# Patient Record
Sex: Female | Born: 1980 | Race: White | Hispanic: No | Marital: Single | Smoking: Current every day smoker
Health system: Southern US, Community
[De-identification: ages and names within clinical notes are randomized; demographics above are authoritative.]

## PROBLEM LIST (undated history)

## (undated) DIAGNOSIS — Z21 Asymptomatic human immunodeficiency virus [HIV] infection status: Secondary | ICD-10-CM

## (undated) DIAGNOSIS — B019 Varicella without complication: Secondary | ICD-10-CM

## (undated) DIAGNOSIS — B20 Human immunodeficiency virus [HIV] disease: Secondary | ICD-10-CM

## (undated) DIAGNOSIS — J45909 Unspecified asthma, uncomplicated: Secondary | ICD-10-CM

## (undated) HISTORY — DX: Human immunodeficiency virus (HIV) disease: B20

## (undated) HISTORY — DX: Asymptomatic human immunodeficiency virus (hiv) infection status: Z21

## (undated) HISTORY — PX: TYMPANOSTOMY TUBE PLACEMENT: SHX32

## (undated) HISTORY — DX: Unspecified asthma, uncomplicated: J45.909

## (undated) HISTORY — DX: Varicella without complication: B01.9

---

## 2010-07-28 LAB — HM PAP SMEAR: HM Pap smear: NORMAL

## 2012-10-06 ENCOUNTER — Emergency Department: Payer: Self-pay | Admitting: Emergency Medicine

## 2012-10-06 LAB — COMPREHENSIVE METABOLIC PANEL
Albumin: 3.8 g/dL (ref 3.4–5.0)
Alkaline Phosphatase: 65 U/L (ref 50–136)
Calcium, Total: 9 mg/dL (ref 8.5–10.1)
Co2: 24 mmol/L (ref 21–32)
Creatinine: 0.74 mg/dL (ref 0.60–1.30)
EGFR (Non-African Amer.): >60
Potassium: 4 mmol/L (ref 3.5–5.1)
Sodium: 139 mmol/L (ref 136–145)
Total Protein: 7.3 g/dL (ref 6.4–8.2)

## 2012-10-06 LAB — WET PREP, GENITAL

## 2012-10-06 LAB — URINALYSIS, COMPLETE
Bilirubin,UR: NEGATIVE
Blood: NEGATIVE
Ketone: NEGATIVE
Leukocyte Esterase: NEGATIVE
Ph: 5 (ref 4.5–8.0)
RBC,UR: 2 /HPF (ref 0–5)
Specific Gravity: 1.024 (ref 1.003–1.030)
Squamous Epithelial: 7

## 2012-10-06 LAB — CBC
MCHC: 33.5 g/dL (ref 32.0–36.0)
MCV: 85 fL (ref 80–100)
RDW: 14.1 % (ref 11.5–14.5)
WBC: 10.9 10*3/uL (ref 3.6–11.0)

## 2012-10-06 LAB — HCG, QUANTITATIVE, PREGNANCY: Beta Hcg, Quant.: <1 m[IU]/mL — ABNORMAL LOW

## 2013-06-27 HISTORY — PX: REPAIR PERONEAL TENDONS ANKLE: SUR1201

## 2013-08-19 ENCOUNTER — Ambulatory Visit: Payer: Self-pay

## 2013-08-19 ENCOUNTER — Other Ambulatory Visit: Payer: Self-pay | Admitting: Occupational Medicine

## 2013-08-19 DIAGNOSIS — R52 Pain, unspecified: Secondary | ICD-10-CM

## 2014-03-21 IMAGING — US US PELV - US TRANSVAGINAL
1 series · 13 of 25 positions shown · non-contrast
Comparison: none

REASON FOR EXAM: pelvic pain
COMMENTS:

PROCEDURE:     US  - US PELVIS EXAM W/TRANSVAGINAL  - October 06, 2012  [DATE]
RESULT:     Comparison: None.
TECHNIQUE: Multiple grayscale and color Doppler images were obtained of the
pelvis via transabdominal and endovaginal ultrasound.

[Series 1: us pelv - us transvaginal · 0.28mm/px · 13 of 125 slices shown]
[im 1/125]
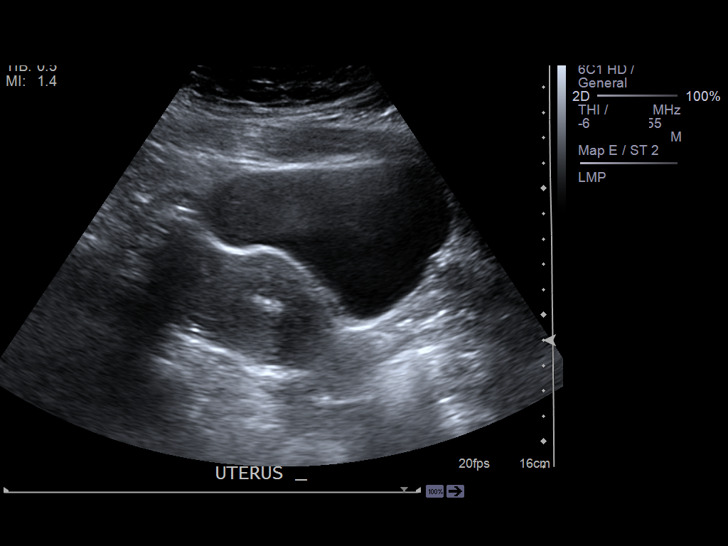
[im 11/125]
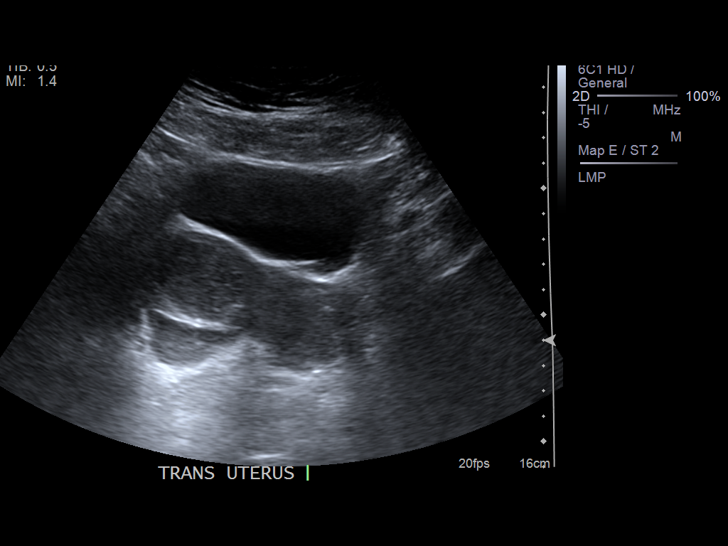
[im 21/125]
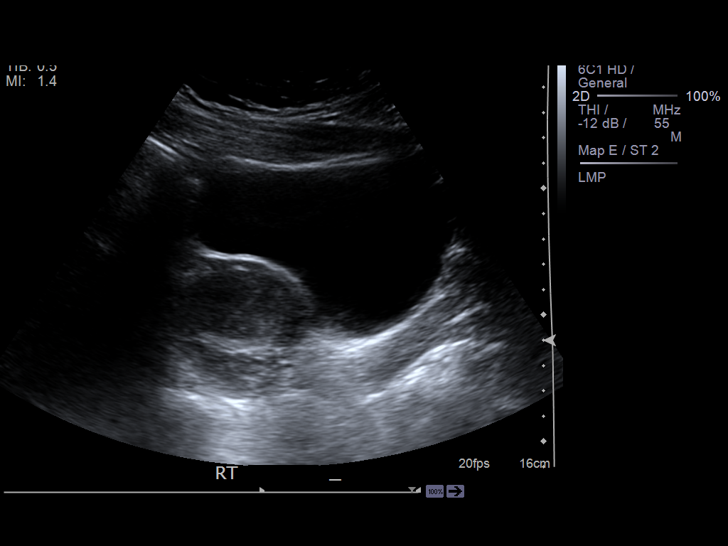
[im 32/125]
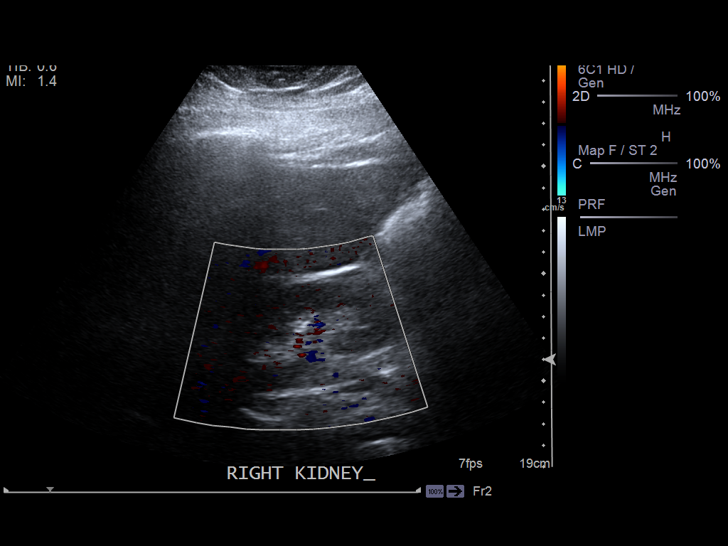
[im 42/125]
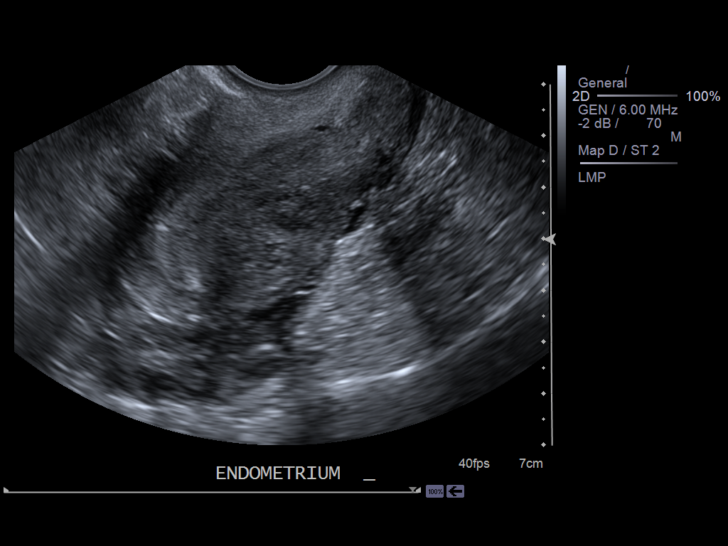
[im 52/125]
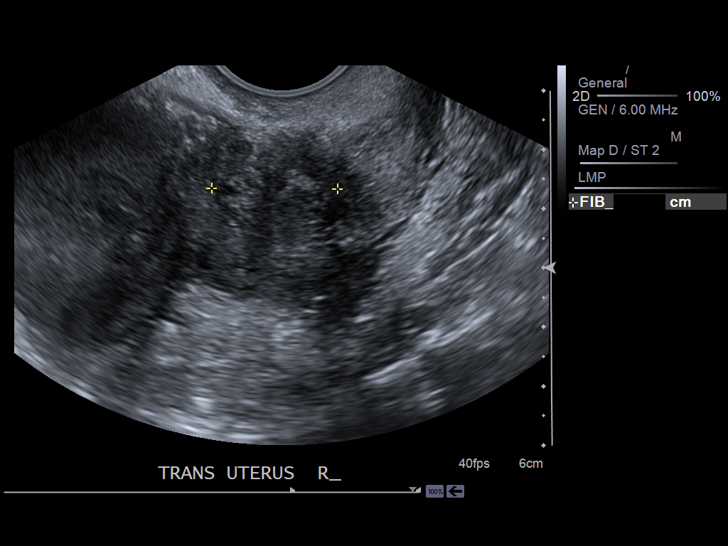
[im 63/125]
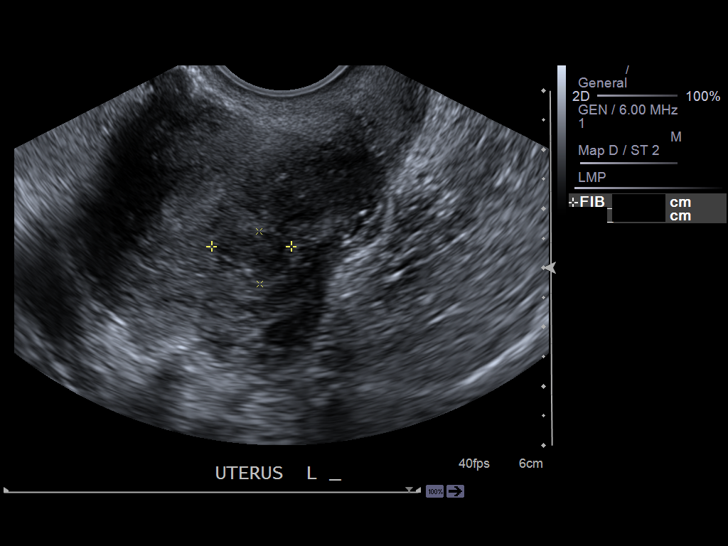
[im 73/125]
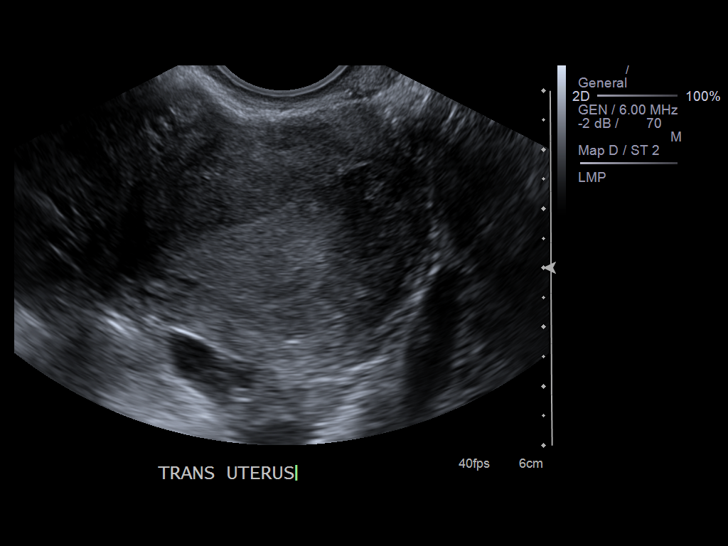
[im 83/125]
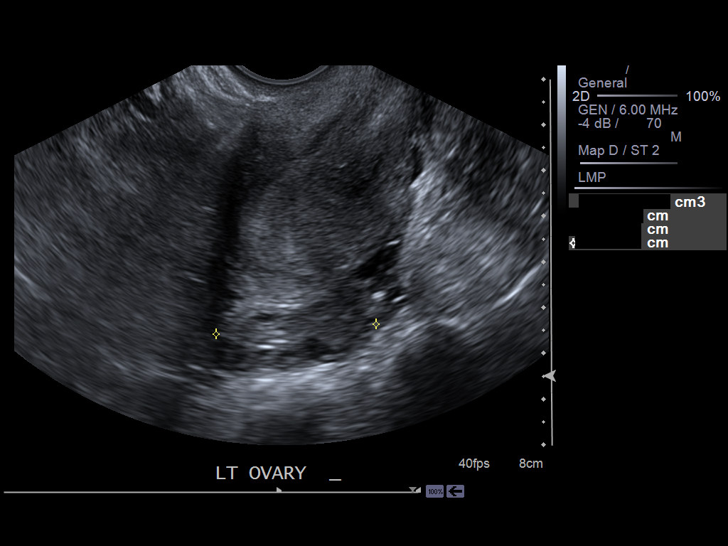
[im 94/125]
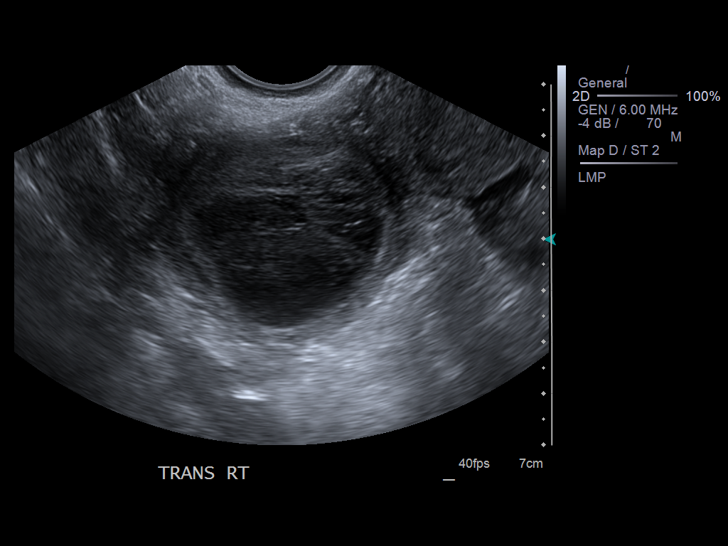
[im 104/125]
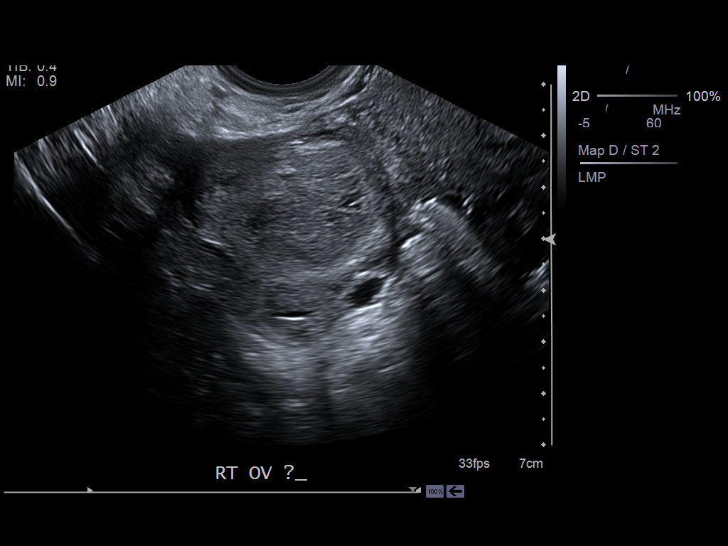
[im 114/125]
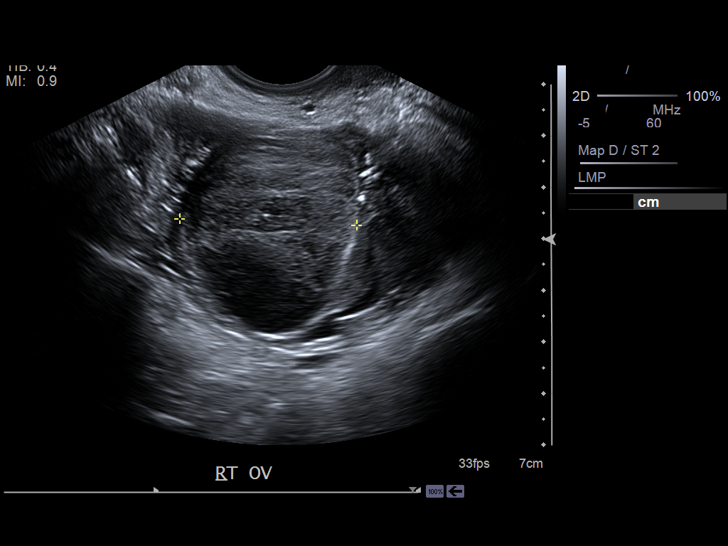
[im 125/125]
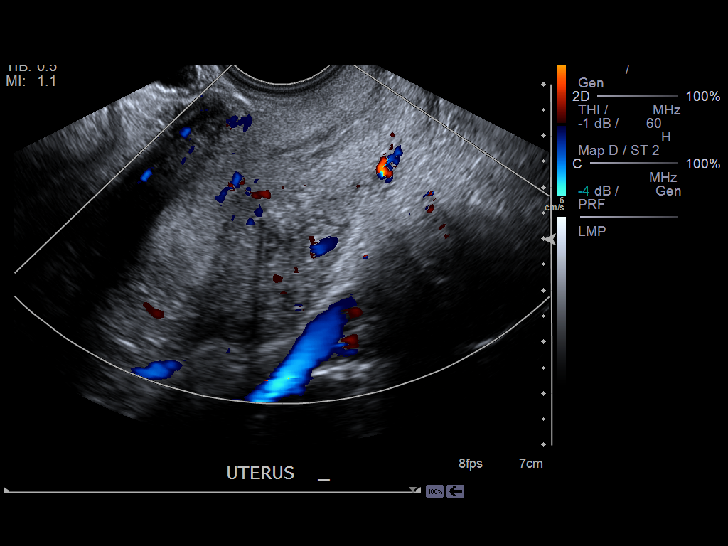

[13 of 25 positions shown; findings below may reference images not displayed]

FINDINGS: The uterus measures 7.3 x 4.9 x 4.3 cm. The endometrial stripe measures 14
mm in thickness. There are 2 round heterogeneous hypoechoic regions within
the uterus which likely represent fibroids. The largest measures 2.2 x 2.1 x
2.1 cm.

The left ovary measures 2.7 x 1.3 x 3.5 cm. The right ovary measures 5.3 x
4.3 x 4.6 cm. There is a heterogeneous mass associated with the right ovary
which demonstrates areas of hypoechogenicity as well as hyperechogenicity.
There is peripheral color Doppler flow. It measures 4.3 x 3.4 x 4.0 cm.
Color Doppler flow is associated with the bilateral ovaries. Spectral
Doppler imaging was not performed. There is a small amount of free fluid in
the pelvis, which is nonspecific.
IMPRESSION: Heterogeneous hypoechoic mass in the right ovary may represent a hemorrhagic
cyst. Endometrioma is a differential consideration. Recommend correlation
with beta-hCG to exclude ectopic pregnancy. Followup pelvic ultrasound is
recommended in 6 weeks to ensure resolution.

## 2014-08-19 ENCOUNTER — Encounter: Payer: Self-pay | Admitting: Nurse Practitioner

## 2014-08-19 ENCOUNTER — Encounter (INDEPENDENT_AMBULATORY_CARE_PROVIDER_SITE_OTHER): Payer: Self-pay

## 2014-08-19 ENCOUNTER — Ambulatory Visit (INDEPENDENT_AMBULATORY_CARE_PROVIDER_SITE_OTHER): Payer: BLUE CROSS/BLUE SHIELD | Admitting: Nurse Practitioner

## 2014-08-19 VITALS — BP 116/82 | HR 76 | Temp 98.2°F | Resp 14 | Ht 66.5 in | Wt 297.1 lb

## 2014-08-19 DIAGNOSIS — Z7689 Persons encountering health services in other specified circumstances: Secondary | ICD-10-CM | POA: Insufficient documentation

## 2014-08-19 DIAGNOSIS — Z720 Tobacco use: Secondary | ICD-10-CM

## 2014-08-19 DIAGNOSIS — Z7189 Other specified counseling: Secondary | ICD-10-CM

## 2014-08-19 DIAGNOSIS — F17200 Nicotine dependence, unspecified, uncomplicated: Secondary | ICD-10-CM

## 2014-08-19 DIAGNOSIS — B001 Herpesviral vesicular dermatitis: Secondary | ICD-10-CM | POA: Insufficient documentation

## 2014-08-19 MED ORDER — VALACYCLOVIR HCL 1 G PO TABS: 1000.0000 mg | ORAL_TABLET | Freq: Two times a day (BID) | ORAL | Status: DC

## 2014-08-19 NOTE — Patient Instructions (Addendum)
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 Take only when you get the cold sores. 20 pills should last awhile.   Make a appointment for physical with PAP.   Welcome to [facility]!!

## 2014-08-19 NOTE — Progress Notes (Signed)
Subjective:    Patient ID: Shelia Richardson, female    DOB: August 01, 1980, 34 y.o.   MRN: [id number]  HPI  Shelia Richardson is a 34 yo female establishing care and CC of cold sores.   1) New pt info:  Diet- Low calorie diet- avg. 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  HENT: Negative for tinnitus and trouble swallowing.   Eyes: Negative for visual disturbance.  Respiratory: Negative for chest tightness, shortness of breath and wheezing.   Cardiovascular: Negative for chest pain, palpitations and leg swelling.  Gastrointestinal: Negative for nausea, vomiting, abdominal pain, diarrhea and constipation.  Genitourinary: Negative for dysuria.  Musculoskeletal: Negative for back pain and neck pain.  Skin: Negative for rash.  Allergic/Immunologic: Negative for environmental allergies and food allergies.  Neurological: Negative for dizziness, weakness, numbness and headaches.  Hematological: Does not bruise/bleed easily.  Psychiatric/Behavioral: Negative for suicidal ideas. The patient is not nervous/anxious.    Past Medical History  Diagnosis Date  . Asthma   . HIV infection     Testing only.  Negative  . Chicken pox     History   Social History  . Marital Status: Single    Spouse Name: N/A  . Number of Children: N/A  . Years of Education: N/A   Occupational History  . Not on file.   Social History Main Topics  .  Smoking status: Current Every Day Smoker -- 1.00 packs/day for 15 years    Types: Cigarettes  . Smokeless tobacco: Not on file  . Alcohol Use: No     Comment: 1-2 x a year  . Drug Use: No  . Sexual Activity:    Partners: Male     Comment: 1 partner   Other Topics Concern  . 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 Tympanostomy tube placement      As a child  . Repair peroneal tendons ankle  2015    Right ankle tendon    Family History  Problem Relation Age of Onset  . Arthritis Mother   . Mental illness Mother   . Diabetes Father   . Mental illness Sister   . Mental illness Brother   . Cancer Maternal Grandmother   . Cancer Maternal Grandfather   . Cancer Paternal Grandmother   . Cancer Paternal Grandfather   . Diabetes Paternal Grandfather     Allergies  Allergen Reactions  . Septra [Sulfamethoxazole-Trimethoprim]     No current outpatient prescriptions on file prior to visit.   No current facility-administered medications on file prior to visit.  Objective:   Physical Exam  Constitutional: She is oriented to person, place, and time. She appears well-developed and well-nourished. No distress.  BP 116/82 mmHg  Pulse 76  Temp(Src) 98.2 F (36.8 C) (Oral)  Resp 14  Ht 5' 6.5" (1.689 m)  Wt 297 lb 1.9 oz (134.773 kg)  BMI 47.24 kg/m2  SpO2 97%  LMP  (Approximate)   HENT:  Head: Normocephalic and atraumatic.  Right Ear: External ear normal.  Left Ear: External ear normal.  Eyes: Right eye exhibits no discharge. Left eye exhibits no discharge. No scleral icterus.  Galsses  Cardiovascular: Normal rate, regular rhythm, normal heart sounds and intact distal pulses.  Exam reveals no gallop and no friction rub.   No murmur heard. Pulmonary/Chest: Effort  normal and breath sounds normal. No respiratory distress. She has no wheezes. She has no rales. She exhibits no tenderness.  Abdominal:  obese  Neurological: She is alert and oriented to person, place, and time. No cranial nerve deficit. She exhibits normal muscle tone. Coordination normal.  Skin: Skin is warm and dry. No rash noted. She is not diaphoretic.  Psychiatric: She has a normal mood and affect. Her behavior is normal. Judgment and thought content normal.      Assessment & Plan:

## 2014-08-19 NOTE — Progress Notes (Signed)
Pre visit review using our clinic review tool, if applicable. No additional management support is needed unless otherwise documented below in the visit note. 

## 2014-08-20 ENCOUNTER — Telehealth: Payer: Self-pay | Admitting: Nurse Practitioner

## 2014-08-20 NOTE — Telephone Encounter (Signed)
emmi mailed  °

## 2014-08-22 NOTE — Assessment & Plan Note (Signed)
Pt is not ready to quit. She reports her family wants to her stop smoking because they have and she reports she wants to continue smoking. She is aware of health risks and this is not a deterrent for her.

## 2014-08-22 NOTE — Assessment & Plan Note (Signed)
Valacyclovir eRx to take twice a day once at the beginning of a cold sore. Will follow.

## 2014-08-22 NOTE — Assessment & Plan Note (Signed)
Discussed acute and chronic issues. Reviewed health maintenance measures, PFSHx, and immunizations. No labs to obtain at this time. Will wait until annual physical.

## 2014-09-08 ENCOUNTER — Ambulatory Visit (INDEPENDENT_AMBULATORY_CARE_PROVIDER_SITE_OTHER): Payer: BLUE CROSS/BLUE SHIELD | Admitting: Nurse Practitioner

## 2014-09-08 ENCOUNTER — Encounter: Payer: Self-pay | Admitting: Nurse Practitioner

## 2014-09-08 ENCOUNTER — Other Ambulatory Visit (HOSPITAL_COMMUNITY)
Admission: RE | Admit: 2014-09-08 | Discharge: 2014-09-08 | Disposition: A | Discharge status: Home / Self Care | Payer: BLUE CROSS/BLUE SHIELD | Source: Ambulatory Visit | Attending: Nurse Practitioner | Admitting: Nurse Practitioner

## 2014-09-08 VITALS — BP 116/78 | HR 96 | Temp 98.1°F | Resp 14 | Ht 66.5 in | Wt 296.0 lb

## 2014-09-08 DIAGNOSIS — L02224 Furuncle of groin: Secondary | ICD-10-CM

## 2014-09-08 DIAGNOSIS — Z0000 Encounter for general adult medical examination without abnormal findings: Secondary | ICD-10-CM

## 2014-09-08 DIAGNOSIS — Z131 Encounter for screening for diabetes mellitus: Secondary | ICD-10-CM

## 2014-09-08 DIAGNOSIS — Z1329 Encounter for screening for other suspected endocrine disorder: Secondary | ICD-10-CM | POA: Diagnosis not present

## 2014-09-08 DIAGNOSIS — Z01419 Encounter for gynecological examination (general) (routine) without abnormal findings: Secondary | ICD-10-CM | POA: Diagnosis not present

## 2014-09-08 DIAGNOSIS — Z13220 Encounter for screening for lipoid disorders: Secondary | ICD-10-CM | POA: Diagnosis not present

## 2014-09-08 DIAGNOSIS — Z130 Encounter for screening for diseases of the blood and blood-forming organs and certain disorders involving the immune mechanism: Secondary | ICD-10-CM | POA: Diagnosis not present

## 2014-09-08 DIAGNOSIS — L02234 Carbuncle of groin: Secondary | ICD-10-CM

## 2014-09-08 DIAGNOSIS — Z1151 Encounter for screening for human papillomavirus (HPV): Secondary | ICD-10-CM | POA: Insufficient documentation

## 2014-09-08 LAB — COMPREHENSIVE METABOLIC PANEL
ALT: 13 U/L (ref 0–35)
AST: 15 U/L (ref 0–37)
Albumin: 4.1 g/dL (ref 3.5–5.2)
Alkaline Phosphatase: 66 U/L (ref 39–117)
BUN: 14 mg/dL (ref 6–23)
CO2: 27 mEq/L (ref 19–32)
Calcium: 9.4 mg/dL (ref 8.4–10.5)
Chloride: 105 mEq/L (ref 96–112)
Creatinine, Ser: 0.73 mg/dL (ref 0.40–1.20)
GFR: 98.32 mL/min (ref >60.00)
Glucose, Bld: 100 mg/dL — ABNORMAL HIGH (ref 70–99)
Potassium: 4.3 mEq/L (ref 3.5–5.1)
Sodium: 136 mEq/L (ref 135–145)
TOTAL PROTEIN: 7.1 g/dL (ref 6.0–8.3)
Total Bilirubin: 0.2 mg/dL (ref 0.2–1.2)

## 2014-09-08 LAB — LIPID PANEL
Cholesterol: 175 mg/dL (ref 0–200)
HDL: 38.8 mg/dL — ABNORMAL LOW (ref >39.00)
LDL Cholesterol: 98 mg/dL (ref 0–99)
NonHDL: 136.2
Total CHOL/HDL Ratio: 5
Triglycerides: 189 mg/dL — ABNORMAL HIGH (ref 0.0–149.0)
VLDL: 37.8 mg/dL (ref 0.0–40.0)

## 2014-09-08 LAB — TSH: TSH: 3.19 u[IU]/mL (ref 0.35–4.50)

## 2014-09-08 LAB — CBC WITH DIFFERENTIAL/PLATELET
Basophils Absolute: 0 10*3/uL (ref 0.0–0.1)
Basophils Relative: 0.2 % (ref 0.0–3.0)
Eosinophils Absolute: 0.2 10*3/uL (ref 0.0–0.7)
Eosinophils Relative: 1.6 % (ref 0.0–5.0)
HCT: 38.8 % (ref 36.0–46.0)
Hemoglobin: 12.9 g/dL (ref 12.0–15.0)
Lymphocytes Relative: 36.8 % (ref 12.0–46.0)
Lymphs Abs: 4.7 10*3/uL — ABNORMAL HIGH (ref 0.7–4.0)
MCHC: 33.3 g/dL (ref 30.0–36.0)
MCV: 81.8 fl (ref 78.0–100.0)
Monocytes Absolute: 0.7 10*3/uL (ref 0.1–1.0)
Monocytes Relative: 5.7 % (ref 3.0–12.0)
Neutro Abs: 7 10*3/uL (ref 1.4–7.7)
Neutrophils Relative %: 55.7 % (ref 43.0–77.0)
Platelets: 195 10*3/uL (ref 150.0–400.0)
RBC: 4.75 Mil/uL (ref 3.87–5.11)
RDW: 14.7 % (ref 11.5–15.5)
WBC: 12.6 10*3/uL — ABNORMAL HIGH (ref 4.0–10.5)

## 2014-09-08 LAB — HEMOGLOBIN A1C: Hgb A1c MFr Bld: 5.9 % (ref 4.6–6.5)

## 2014-09-08 NOTE — Assessment & Plan Note (Signed)
Discussed acute and chronic issues. Reviewed health maintenance measures, PFSHx, and immunizations. Obtain routine labs TSH, Lipid panel, CBC w/ diff, A1c, and CMET.

## 2014-09-08 NOTE — Assessment & Plan Note (Signed)
Cultured boil on right groin area (skin of pubis). Draining today. Will follow.

## 2014-09-08 NOTE — Assessment & Plan Note (Signed)
PAP done today, normal pelvic, breast exam.

## 2014-09-08 NOTE — Patient Instructions (Signed)

## 2014-09-08 NOTE — Addendum Note (Signed)
Addended by: Karlene Einstein D on: 09/08/2014 02:32 PM   Modules accepted: Orders

## 2014-09-08 NOTE — Progress Notes (Signed)
Subjective:    Patient ID: Shelia Richardson, female    DOB: 12-16-1980, 34 y.o.   MRN: [id number]  HPI  Shelia Richardson is a 34 yo female here for her annual exam.  1) Health Maintenance-   Diet- Low calorie diet down 1 lb  Exercise- Moving and lifting at work   Immunizations- Refuse flu  Pap-Today  Eye Exam- UTD  Dental Exam- Not UTD  2) Chronic Problems-  Asthma- No exacerbations  Tobacco use- not cutting down yet  3) Acute Problems-  Boil- draining- Right side, white/yellow/red drainage, started draining on its own. No warm compresses   Review of Systems  Constitutional: Negative for fever, chills, diaphoresis, appetite change, fatigue and unexpected weight change.  HENT: Negative for tinnitus and trouble swallowing.   Eyes: Negative for visual disturbance.  Respiratory: Positive for cough. Negative for chest tightness, shortness of breath and wheezing.        Smokers cough  Cardiovascular: Negative for chest pain, palpitations and leg swelling.  Gastrointestinal: Negative for nausea, vomiting, abdominal pain, diarrhea, constipation and blood in stool.  Endocrine: Negative for cold intolerance, heat intolerance, polydipsia, polyphagia and polyuria.  Genitourinary: Negative for dysuria, hematuria, vaginal discharge and vaginal pain.  Musculoskeletal: Positive for back pain. Negative for myalgias, arthralgias, gait problem and neck pain.  Skin: Negative for color change and rash.  Neurological: Negative for dizziness, weakness, numbness and headaches.  Hematological: Does not bruise/bleed easily.  Psychiatric/Behavioral: Negative for suicidal ideas and sleep disturbance. The patient is not nervous/anxious.    Past Medical History  Diagnosis Date  . Asthma   . HIV infection     Testing only.  Negative  . Chicken pox     History   Social History  . Marital Status: Single    Spouse Name: N/A  . Number of Children: N/A  . Years of Education: N/A   Occupational  History  . Not on file.   Social History Main Topics  . Smoking status: Current Every Day Smoker -- 1.00 packs/day for 15 years    Types: Cigarettes  . Smokeless tobacco: Not on file  . Alcohol Use: No     Comment: 1-2 x a year  . Drug Use: No  . Sexual Activity:    Partners: Male     Comment: 1 partner   Other Topics Concern  . 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 Tympanostomy tube placement      As a child  . Repair peroneal tendons ankle  2015    Right ankle tendon    Family History  Problem Relation Age of Onset  . Arthritis Mother   . Mental illness Mother   . Diabetes Father   . Mental illness Sister   . Mental illness Brother   . Cancer Maternal Grandmother   . Cancer Maternal Grandfather   . Cancer Paternal Grandmother   . Cancer Paternal Grandfather   . Diabetes Paternal Grandfather     Allergies  Allergen Reactions  . Septra [Sulfamethoxazole-Trimethoprim]     Current Outpatient Prescriptions on File Prior to Visit  Medication Sig Dispense Refill  . Naproxen Sodium (ALEVE PO) Take 2 tablets by mouth as needed.    . valACYclovir (  VALTREX) 1000 MG tablet Take 1 tablet (1,000 mg total) by mouth 2 (two) times daily. 20 tablet 2   No current facility-administered medications on file prior to visit.      Objective:   Physical Exam  Constitutional: She is oriented to person, place, and time. She appears well-developed and well-nourished. No distress.  BP 116/78 mmHg  Pulse 96  Temp(Src) 98.1 F (36.7 C) (Oral)  Resp 14  Ht 5' 6.5" (1.689 m)  Wt 296 lb (134.265 kg)  BMI 47.07 kg/m2  SpO2 97%  LMP  (Approximate)   HENT:  Head: Normocephalic and atraumatic.  Right Ear: External ear normal.  Left Ear: External ear normal.  Nose: Nose normal.    Mouth/Throat: Oropharynx is clear and moist. No oropharyngeal exudate.  TMs scarred from previous ear infections and tubes bilaterally  Eyes: Conjunctivae and EOM are normal. Pupils are equal, round, and reactive to light. Right eye exhibits no discharge. Left eye exhibits no discharge. No scleral icterus.  Neck: Normal range of motion. Neck supple. No thyromegaly present.  Cardiovascular: Normal rate, regular rhythm, normal heart sounds and intact distal pulses.  Exam reveals no gallop and no friction rub.   No murmur heard. Pulmonary/Chest: Effort normal and breath sounds normal. No respiratory distress. She has no wheezes. She has no rales. She exhibits no tenderness.  Abdominal: Soft. Bowel sounds are normal. She exhibits no distension and no mass. There is no tenderness. There is no rebound and no guarding.  Genitourinary: Vagina normal.    No breast swelling, tenderness, discharge or bleeding. There is no rash, tenderness, lesion or injury on the right labia. There is no rash, tenderness, lesion or injury on the left labia.  Scars scattered from boils that have healed on pubis, and bilateral thighs.   Musculoskeletal: Normal range of motion. She exhibits no edema or tenderness.  Lymphadenopathy:    She has no cervical adenopathy.  Neurological: She is alert and oriented to person, place, and time. She has normal reflexes. No cranial nerve deficit. She exhibits normal muscle tone. Coordination normal.  Skin: Skin is warm and dry. No rash noted. She is not diaphoretic. No erythema. No pallor.  Psychiatric: She has a normal mood and affect. Her behavior is normal. Judgment and thought content normal.        Assessment & Plan:

## 2014-09-08 NOTE — Progress Notes (Signed)
Pre visit review using our clinic review tool, if applicable. No additional management support is needed unless otherwise documented below in the visit note. 

## 2014-09-09 LAB — CYTOLOGY - PAP

## 2014-09-11 ENCOUNTER — Other Ambulatory Visit: Payer: Self-pay | Admitting: Nurse Practitioner

## 2014-09-11 LAB — WOUND CULTURE: Gram Stain: NONE SEEN

## 2014-09-11 MED ORDER — DOXYCYCLINE HYCLATE 100 MG PO TABS: 100.0000 mg | ORAL_TABLET | Freq: Two times a day (BID) | ORAL | Status: DC

## 2015-01-29 ENCOUNTER — Ambulatory Visit (INDEPENDENT_AMBULATORY_CARE_PROVIDER_SITE_OTHER): Payer: BLUE CROSS/BLUE SHIELD | Admitting: Nurse Practitioner

## 2015-01-29 ENCOUNTER — Encounter: Payer: Self-pay | Admitting: Nurse Practitioner

## 2015-01-29 VITALS — BP 136/80 | HR 107 | Temp 99.5°F | Resp 14 | Ht 66.5 in | Wt 297.8 lb

## 2015-01-29 DIAGNOSIS — H6593 Unspecified nonsuppurative otitis media, bilateral: Secondary | ICD-10-CM | POA: Diagnosis not present

## 2015-01-29 MED ORDER — AMOXICILLIN 500 MG PO CAPS: 500.0000 mg | ORAL_CAPSULE | Freq: Two times a day (BID) | ORAL | Status: DC

## 2015-01-29 NOTE — Patient Instructions (Signed)
Please take a probiotic ( Align, Floraque or Culturelle) while you are on the antibiotic to prevent a serious antibiotic associated diarrhea  Called clostirudium dificile colitis and a vaginal yeast infection.

## 2015-01-29 NOTE — Progress Notes (Signed)
   Subjective:    Patient ID: Shelia Richardson, female    DOB: 06-Jan-1981, 34 y.o.   MRN: [id number]  HPI  Shelia Richardson is a 34 yo female with a CC of ear pain and drainage x 7 days.   1) Dizziness- couldn't drive she reports, when standing or walking pt feels off balance. Some room spinning with change of positions she reports.  bilateral ear pain, drainage right ear clear  No swimming nor airplane travel   Review of Systems  Constitutional: Negative for fever, chills, diaphoresis and fatigue.  HENT: Positive for ear discharge and ear pain.   Respiratory: Negative for shortness of breath and wheezing.   Cardiovascular: Negative for chest pain, palpitations and leg swelling.  Gastrointestinal: Negative for nausea, vomiting and diarrhea.  Skin: Negative for rash.  Neurological: Positive for light-headedness. Negative for dizziness, weakness, numbness and headaches.  Psychiatric/Behavioral: The patient is not nervous/anxious.       Objective:   Physical Exam  Constitutional: She is oriented to person, place, and time. She appears well-developed and well-nourished. No distress.  BP 136/80 mmHg  Pulse 107  Temp(Src) 99.5 F (37.5 C)  Resp 14  Ht 5' 6.5" (1.689 m)  Wt 297 lb 12.8 oz (135.081 kg)  BMI 47.35 kg/m2  SpO2 95%   HENT:  Head: Normocephalic and atraumatic.  Right Ear: External ear normal.  Left Ear: External ear normal.  TM's injected and bulging bilaterally L>R no discharge visualized  Eyes: EOM are normal. Pupils are equal, round, and reactive to light. Right eye exhibits no discharge. Left eye exhibits no discharge. No scleral icterus.  No nystagmus  Cardiovascular: Normal rate, regular rhythm and normal heart sounds.  Exam reveals no gallop and no friction rub.   No murmur heard. Pulmonary/Chest: Effort normal and breath sounds normal. No respiratory distress. She has no wheezes. She has no rales. She exhibits no tenderness.  Musculoskeletal:  Negative romberg    Neurological: She is alert and oriented to person, place, and time. No cranial nerve deficit. She exhibits normal muscle tone. Coordination normal.  Skin: Skin is warm and dry. No rash noted. She is not diaphoretic.  Psychiatric: She has a normal mood and affect. Her behavior is normal. Judgment and thought content normal.      Assessment & Plan:

## 2015-01-29 NOTE — Progress Notes (Signed)
Pre visit review using our clinic review tool, if applicable. No additional management support is needed unless otherwise documented below in the visit note. 

## 2015-02-05 ENCOUNTER — Encounter: Payer: Self-pay | Admitting: Nurse Practitioner

## 2015-02-05 DIAGNOSIS — H6590 Unspecified nonsuppurative otitis media, unspecified ear: Secondary | ICD-10-CM | POA: Insufficient documentation

## 2015-02-05 NOTE — Assessment & Plan Note (Signed)
Bilateral. Pt reports right is the worst, but left looks visually worse. Will treat with amoxicillin bid x 10 days. Encouraged probiotics. FU prn worsening/failure to improve.

## 2015-05-25 ENCOUNTER — Ambulatory Visit (INDEPENDENT_AMBULATORY_CARE_PROVIDER_SITE_OTHER): Payer: BLUE CROSS/BLUE SHIELD | Admitting: Nurse Practitioner

## 2015-05-25 ENCOUNTER — Encounter: Payer: Self-pay | Admitting: Nurse Practitioner

## 2015-05-25 VITALS — BP 138/76 | HR 101 | Temp 99.7°F | Resp 14 | Ht 66.5 in | Wt 305.2 lb

## 2015-05-25 DIAGNOSIS — J069 Acute upper respiratory infection, unspecified: Secondary | ICD-10-CM

## 2015-05-25 DIAGNOSIS — H6593 Unspecified nonsuppurative otitis media, bilateral: Secondary | ICD-10-CM | POA: Diagnosis not present

## 2015-05-25 MED ORDER — AMOXICILLIN-POT CLAVULANATE 875-125 MG PO TABS: 1.0000 | ORAL_TABLET | Freq: Two times a day (BID) | ORAL | Status: DC

## 2015-05-25 MED ORDER — ALBUTEROL SULFATE HFA 108 (90 BASE) MCG/ACT IN AERS: 2.0000 | INHALATION_SPRAY | Freq: Four times a day (QID) | RESPIRATORY_TRACT | Status: DC | PRN

## 2015-05-25 NOTE — Assessment & Plan Note (Signed)
URI symptoms worsening x 4 days with OME right ear. Augmentin twice daily PO x 7 days and inhaler sent to pharmacy. FU prn worsening/failure to improve.

## 2015-05-25 NOTE — Progress Notes (Signed)
Patient ID: SANISHA CLANTON, female    DOB: 08/14/1980  Age: 34 y.o. MRN: [id number]  CC: Otalgia   HPI PAYSEN HAMILTON presents for CC of bilateral ear pain x 4 days.   1) Ear pain started on Friday, Left ear sore to the touch yesterday evening   Coughing, rhinorrhea, sore throat, wheezing, and chest tightness. Denies sick contacts  Treatment to date: Peroxide x 5 min right ear., alcohol and went to sleep  Felt improved   Dayquil for URI symptoms  History Swannie has a past medical history of Asthma; HIV infection; and Chicken pox.   She has past surgical history that includes Tympanostomy tube placement and Repair peroneal tendons ankle (2015).   Her family history includes Arthritis in her mother; Cancer in her maternal grandfather, maternal grandmother, paternal grandfather, and paternal grandmother; Diabetes in her father and paternal grandfather; Mental illness in her brother, mother, and sister.She reports that she has been smoking Cigarettes.  She has a 15 pack-year smoking history. She does not have any smokeless tobacco history on file. She reports that she does not drink alcohol or use illicit drugs.  Outpatient Prescriptions Prior to Visit  Medication Sig Dispense Refill  . Naproxen Sodium (ALEVE PO) Take 2 tablets by mouth as needed.    . valACYclovir (VALTREX) 1000 MG tablet Take 1 tablet (1,000 mg total) by mouth 2 (two) times daily. 20 tablet 2  . amoxicillin (AMOXIL) 500 MG capsule Take 1 capsule (500 mg total) by mouth 2 (two) times daily. 20 capsule 0   No facility-administered medications prior to visit.    ROS Review of Systems  Constitutional: Negative for fever, chills, diaphoresis and fatigue.  HENT: Positive for congestion, ear pain, postnasal drip, rhinorrhea, sinus pressure, sneezing and sore throat. Negative for ear discharge.   Eyes: Negative for visual disturbance.  Respiratory: Positive for cough, chest tightness and wheezing. Negative for  shortness of breath.   Cardiovascular: Negative for chest pain, palpitations and leg swelling.  Gastrointestinal: Negative for nausea, vomiting and diarrhea.  Skin: Negative for rash.  Neurological: Negative for dizziness, numbness and headaches.  Psychiatric/Behavioral: The patient is not nervous/anxious.     Objective:  BP 138/76 mmHg  Pulse 101  Temp(Src) 99.7 F (37.6 C)  Resp 14  Ht 5' 6.5" (1.689 m)  Wt 305 lb 3.2 oz (138.438 kg)  BMI 48.53 kg/m2  SpO2 97%  Physical Exam  Constitutional: She is oriented to person, place, and time. She appears well-developed and well-nourished. No distress.  HENT:  Head: Normocephalic and atraumatic.  Right Ear: External ear normal.  Left Ear: External ear normal.  TM's injected bilaterally with retraction on the right  Eyes: EOM are normal. Pupils are equal, round, and reactive to light. Right eye exhibits no discharge. Left eye exhibits no discharge. No scleral icterus.  Neck: Normal range of motion. Neck supple.  Cardiovascular: Normal rate, regular rhythm and normal heart sounds.  Exam reveals no gallop and no friction rub.   No murmur heard. Pulmonary/Chest: Effort normal and breath sounds normal. No respiratory distress. She has no wheezes. She has no rales. She exhibits no tenderness.  Decreased lung sounds all over  Lymphadenopathy:    She has no cervical adenopathy.  Neurological: She is alert and oriented to person, place, and time. No cranial nerve deficit. She exhibits normal muscle tone. Coordination normal.  Skin: Skin is warm and dry. No rash noted. She is not diaphoretic.  Psychiatric: She has a normal  mood and affect. Her behavior is normal. Judgment and thought content normal.   Assessment & Plan:   Dimity was seen today for otalgia.  Diagnoses and all orders for this visit:  OME (otitis media with effusion), bilateral  Acute URI  Other orders -     amoxicillin-clavulanate (AUGMENTIN) 875-125 MG tablet; Take 1  tablet by mouth 2 (two) times daily. -     albuterol (PROVENTIL HFA;VENTOLIN HFA) 108 (90 BASE) MCG/ACT inhaler; Inhale 2 puffs into the lungs every 6 (six) hours as needed for wheezing or shortness of breath.  I have discontinued Ms. Teutsch's amoxicillin. I am also having her start on amoxicillin-clavulanate and albuterol. Additionally, I am having her maintain her Naproxen Sodium (ALEVE PO) and valACYclovir.  Meds ordered this encounter  Medications  . amoxicillin-clavulanate (AUGMENTIN) 875-125 MG tablet    Sig: Take 1 tablet by mouth 2 (two) times daily.    Dispense:  14 tablet    Refill:  0    Order Specific Question:  Supervising Provider    Answer:  Deborra Medina L [[id number]]  . albuterol (PROVENTIL HFA;VENTOLIN HFA) 108 (90 BASE) MCG/ACT inhaler    Sig: Inhale 2 puffs into the lungs every 6 (six) hours as needed for wheezing or shortness of breath.    Dispense:  1 Inhaler    Refill:  2    Order Specific Question:  Supervising Provider    Answer:  Crecencio Mc [[id number]]     Follow-up: Return if symptoms worsen or fail to improve.

## 2015-05-25 NOTE — Assessment & Plan Note (Signed)
Right ear this time. Augmentin for sinus and OME symptoms. Encouraged probiotics. FU prn worsening/failure to improve.

## 2015-05-25 NOTE — Progress Notes (Signed)
Pre visit review using our clinic review tool, if applicable. No additional management support is needed unless otherwise documented below in the visit note. 

## 2015-05-25 NOTE — Patient Instructions (Signed)
Please take a probiotic ( Align, Floraque or Culturelle) while you are on the antibiotic to prevent a serious antibiotic associated diarrhea  Called clostirudium dificile colitis and a vaginal yeast infection.  Call us if not helpful

## 2016-03-08 ENCOUNTER — Ambulatory Visit
Admission: RE | Admit: 2016-03-08 | Discharge: 2016-03-08 | Disposition: A | Discharge status: Home / Self Care | Payer: BLUE CROSS/BLUE SHIELD | Source: Ambulatory Visit | Attending: Family | Admitting: Family

## 2016-03-08 ENCOUNTER — Ambulatory Visit (INDEPENDENT_AMBULATORY_CARE_PROVIDER_SITE_OTHER): Payer: BLUE CROSS/BLUE SHIELD | Admitting: Family

## 2016-03-08 ENCOUNTER — Encounter: Payer: Self-pay | Admitting: Family

## 2016-03-08 ENCOUNTER — Telehealth: Payer: Self-pay | Admitting: Family

## 2016-03-08 VITALS — BP 124/76 | HR 108 | Temp 98.3°F | Ht 67.0 in | Wt 300.0 lb

## 2016-03-08 DIAGNOSIS — D251 Intramural leiomyoma of uterus: Secondary | ICD-10-CM | POA: Insufficient documentation

## 2016-03-08 DIAGNOSIS — R1030 Lower abdominal pain, unspecified: Secondary | ICD-10-CM

## 2016-03-08 DIAGNOSIS — N839 Noninflammatory disorder of ovary, fallopian tube and broad ligament, unspecified: Secondary | ICD-10-CM | POA: Diagnosis not present

## 2016-03-08 DIAGNOSIS — N838 Other noninflammatory disorders of ovary, fallopian tube and broad ligament: Secondary | ICD-10-CM

## 2016-03-08 LAB — POCT URINALYSIS DIPSTICK
BILIRUBIN UA: NEGATIVE
GLUCOSE UA: NEGATIVE
Ketones, UA: NEGATIVE
Leukocytes, UA: NEGATIVE
NITRITE UA: NEGATIVE
Protein, UA: NEGATIVE
SPEC GRAV UA: 1.01
Urobilinogen, UA: 0.2
pH, UA: 7

## 2016-03-08 LAB — CBC WITH DIFFERENTIAL/PLATELET
BASOS PCT: 0.2 % (ref 0.0–3.0)
Basophils Absolute: 0 10*3/uL (ref 0.0–0.1)
EOS ABS: 0.1 10*3/uL (ref 0.0–0.7)
EOS PCT: 0.9 % (ref 0.0–5.0)
HEMATOCRIT: 39.4 % (ref 36.0–46.0)
Hemoglobin: 13.3 g/dL (ref 12.0–15.0)
LYMPHS PCT: 32.3 % (ref 12.0–46.0)
Lymphs Abs: 3 10*3/uL (ref 0.7–4.0)
MCHC: 33.7 g/dL (ref 30.0–36.0)
MCV: 83.1 fl (ref 78.0–100.0)
Monocytes Absolute: 0.5 10*3/uL (ref 0.1–1.0)
Monocytes Relative: 5.2 % (ref 3.0–12.0)
NEUTROS ABS: 5.7 10*3/uL (ref 1.4–7.7)
Neutrophils Relative %: 61.4 % (ref 43.0–77.0)
PLATELETS: 198 10*3/uL (ref 150.0–400.0)
RBC: 4.74 Mil/uL (ref 3.87–5.11)
RDW: 14.8 % (ref 11.5–15.5)
WBC: 9.3 10*3/uL (ref 4.0–10.5)

## 2016-03-08 LAB — COMPREHENSIVE METABOLIC PANEL
ALT: 14 U/L (ref 0–35)
AST: 12 U/L (ref 0–37)
Albumin: 4.1 g/dL (ref 3.5–5.2)
Alkaline Phosphatase: 54 U/L (ref 39–117)
BUN: 8 mg/dL (ref 6–23)
CALCIUM: 9.2 mg/dL (ref 8.4–10.5)
CHLORIDE: 102 meq/L (ref 96–112)
CO2: 29 meq/L (ref 19–32)
Creatinine, Ser: 0.74 mg/dL (ref 0.40–1.20)
GFR: 95.89 mL/min (ref >60.00)
Glucose, Bld: 106 mg/dL — ABNORMAL HIGH (ref 70–99)
POTASSIUM: 4.2 meq/L (ref 3.5–5.1)
Sodium: 136 mEq/L (ref 135–145)
Total Bilirubin: 0.3 mg/dL (ref 0.2–1.2)
Total Protein: 7.2 g/dL (ref 6.0–8.3)

## 2016-03-08 LAB — LIPASE: LIPASE: 6 U/L — AB (ref 11.0–59.0)

## 2016-03-08 LAB — POCT URINE PREGNANCY: Preg Test, Ur: NEGATIVE

## 2016-03-08 NOTE — Progress Notes (Signed)
Pre visit review using our clinic review tool, if applicable. No additional management support is needed unless otherwise documented below in the visit note. 

## 2016-03-08 NOTE — Patient Instructions (Signed)
Pending stat workup. If pain acutely worsens during this workup., Please go directly to the emergency room.

## 2016-03-08 NOTE — Progress Notes (Signed)
Subjective:    Patient ID: Shelia Richardson, female    DOB: [birth year], 35 y.o.   MRN: [id number]  CC: Shelia Richardson is a 35 y.o. female who presents today for an acute visit.    HPI: Patient presents for acute visit with chief complaint of 'feeling weak' after sharp pain yesterday midline, suprapubic. Took old pain medication at midnight and pain is now dull. .h/o of ovarian cysts bursting 4 years ago- which at that time had UTI as well. Endorses nausea. No fever, abdominal pain.   LMP one week ago. Non concerns for pregnancy or STDS.   Will return for CPE.     HISTORY:  Past Medical History:  Diagnosis Date  . Asthma   . Chicken pox   . HIV infection ([place])    Testing only.  Negative   Past Surgical History:  Procedure Laterality Date  . REPAIR PERONEAL TENDONS ANKLE  2015   Right ankle tendon  . TYMPANOSTOMY TUBE PLACEMENT     As a child   Family History  Problem Relation Age of Onset  . Arthritis Mother   . Mental illness Mother   . Mental illness Sister   . Mental illness Brother   . Cancer Maternal Grandmother   . Cancer Maternal Grandfather   . Cancer Paternal Grandmother   . Cancer Paternal Grandfather   . Diabetes Paternal Grandfather   . Diabetes Father     Allergies: Septra [sulfamethoxazole-trimethoprim] Current Outpatient Prescriptions on File Prior to Visit  Medication Sig Dispense Refill  . albuterol (PROVENTIL HFA;VENTOLIN HFA) 108 (90 BASE) MCG/ACT inhaler Inhale 2 puffs into the lungs every 6 (six) hours as needed for wheezing or shortness of breath. 1 Inhaler 2  . amoxicillin-clavulanate (AUGMENTIN) 875-125 MG tablet Take 1 tablet by mouth 2 (two) times daily. 14 tablet 0  . Naproxen Sodium (ALEVE PO) Take 2 tablets by mouth as needed.    . valACYclovir (VALTREX) 1000 MG tablet Take 1 tablet (1,000 mg total) by mouth 2 (two) times daily. 20 tablet 2   No current facility-administered medications on file prior to visit.     Social History    Substance Use Topics  . Smoking status: Current Every Day Smoker    Packs/day: 1.00    Years: 15.00    Types: Cigarettes  . Smokeless tobacco: Never Used  . Alcohol use No     Comment: 1-2 x a year    Review of Systems  Constitutional: Negative for chills and fever.  Respiratory: Negative for cough.   Cardiovascular: Negative for chest pain and palpitations.  Gastrointestinal: Negative for abdominal pain, constipation, nausea and vomiting.  Genitourinary: Positive for pelvic pain. Negative for dysuria, flank pain, frequency, hematuria, urgency, vaginal bleeding, vaginal discharge and vaginal pain.      Objective:    BP 124/76   Pulse (!) 108   Temp 98.3 F (36.8 C) (Oral)   Ht 5\' 7"  (1.702 m)   Wt 300 lb (136.1 kg)   SpO2 98%   BMI 46.99 kg/m    Physical Exam  Constitutional: She appears well-developed and well-nourished.  Cardiovascular: Normal rate, regular rhythm, normal heart sounds and normal pulses.   Pulmonary/Chest: Effort normal and breath sounds normal. She has no wheezes. She has no rhonchi. She has no rales.  Abdominal: Soft. Normal appearance and bowel sounds are normal. She exhibits no distension, no fluid wave, no ascites and no mass. There is tenderness in the suprapubic area. There is  no rigidity, no rebound, no guarding, no CVA tenderness, no tenderness at McBurney's point and negative Murphy's sign.  No periumbilical ecchymoses.  Neurological: She is alert.  Skin: Skin is warm and dry.  Psychiatric: She has a normal mood and affect. Her speech is normal and behavior is normal. Thought content normal.  Vitals reviewed.      Assessment & Plan:   1. Suprapubic pain, unspecified laterality Concern for ruptured ovarian cyst. Alternatively considering appendicitis. Pending stat abdominal, pelvic, transvaginal ultrasound. Pending stat lab work. Advised patient if in the interim if the pain acutely worsens, she must get directly to the emergency room.  Patient verbalized understanding of this. UA pregnancy negative. UA is also negative for nitrites, leukocytes, blood. Low clinical suspicion for ectopic, renal stone, or UTI.  - POCT urine pregnancy - POCT urinalysis dipstick - CULTURE, URINE COMPREHENSIVE - CBC with Differential/Platelet - Comprehensive metabolic panel - Lipase - US Abdomen Complete - US Pelvis Complete - US OB Transvaginal    I am having Shelia Richardson maintain her Naproxen Sodium (ALEVE PO), valACYclovir, amoxicillin-clavulanate, and albuterol.   No orders of the defined types were placed in this encounter.   Return precautions given.   Risks, benefits, and alternatives of the medications and treatment plan prescribed today were discussed, and patient expressed understanding.   Education regarding symptom management and diagnosis given to patient on AVS.  Continue to follow with Shelia Battiest, NP for routine health maintenance.   Shelia Richardson and I agreed with plan.   Shelia Paris, FNP

## 2016-03-08 NOTE — Telephone Encounter (Signed)
I called patient to discuss lab results which were largely normal. There is no leukocytes, WBCs, or blood in her urine to suggest UTI. No leukocytosis. Electrolytes, kidney function, lipase all normal. Pending urine culture.  Transvaginal ultrasound does show right mass on ovary, most likely benign. Also showed likely uterine fibroid. I discussed with patient and we jointly agreed a referral to OB/GYN for further evaluation, and management.  Patient is not having dysuria or urinary frequency. We jointly agreed to wait on urine culture prior to treating for any asymptomatic urinary tract infection.

## 2016-03-10 LAB — CULTURE, URINE COMPREHENSIVE

## 2016-06-08 ENCOUNTER — Ambulatory Visit (INDEPENDENT_AMBULATORY_CARE_PROVIDER_SITE_OTHER): Payer: BLUE CROSS/BLUE SHIELD | Admitting: Family

## 2016-06-08 ENCOUNTER — Encounter: Payer: Self-pay | Admitting: Family

## 2016-06-08 VITALS — BP 118/80 | HR 99 | Temp 97.8°F | Ht 67.0 in | Wt 290.4 lb

## 2016-06-08 DIAGNOSIS — Z0000 Encounter for general adult medical examination without abnormal findings: Secondary | ICD-10-CM

## 2016-06-08 LAB — LIPID PANEL
CHOL/HDL RATIO: 5
CHOLESTEROL: 196 mg/dL (ref 0–200)
HDL: 41.7 mg/dL (ref >39.00)
LDL Cholesterol: 120 mg/dL — ABNORMAL HIGH (ref 0–99)
NonHDL: 154.01
TRIGLYCERIDES: 169 mg/dL — AB (ref 0.0–149.0)
VLDL: 33.8 mg/dL (ref 0.0–40.0)

## 2016-06-08 LAB — COMPREHENSIVE METABOLIC PANEL
ALBUMIN: 4.1 g/dL (ref 3.5–5.2)
ALK PHOS: 62 U/L (ref 39–117)
ALT: 11 U/L (ref 0–35)
AST: 10 U/L (ref 0–37)
BILIRUBIN TOTAL: 0.4 mg/dL (ref 0.2–1.2)
BUN: 10 mg/dL (ref 6–23)
CALCIUM: 9 mg/dL (ref 8.4–10.5)
CO2: 26 mEq/L (ref 19–32)
Chloride: 105 mEq/L (ref 96–112)
Creatinine, Ser: 0.7 mg/dL (ref 0.40–1.20)
GFR: 102.09 mL/min (ref >60.00)
Glucose, Bld: 113 mg/dL — ABNORMAL HIGH (ref 70–99)
POTASSIUM: 4.2 meq/L (ref 3.5–5.1)
Sodium: 139 mEq/L (ref 135–145)
TOTAL PROTEIN: 6.7 g/dL (ref 6.0–8.3)

## 2016-06-08 LAB — CBC WITH DIFFERENTIAL/PLATELET
BASOS ABS: 0 10*3/uL (ref 0.0–0.1)
Basophils Relative: 0.3 % (ref 0.0–3.0)
EOS PCT: 2 % (ref 0.0–5.0)
Eosinophils Absolute: 0.2 10*3/uL (ref 0.0–0.7)
HEMATOCRIT: 41 % (ref 36.0–46.0)
Hemoglobin: 13.5 g/dL (ref 12.0–15.0)
LYMPHS ABS: 2.6 10*3/uL (ref 0.7–4.0)
LYMPHS PCT: 31.1 % (ref 12.0–46.0)
MCHC: 33 g/dL (ref 30.0–36.0)
MCV: 83.2 fl (ref 78.0–100.0)
MONOS PCT: 4.9 % (ref 3.0–12.0)
Monocytes Absolute: 0.4 10*3/uL (ref 0.1–1.0)
Neutro Abs: 5.2 10*3/uL (ref 1.4–7.7)
Neutrophils Relative %: 61.7 % (ref 43.0–77.0)
Platelets: 174 10*3/uL (ref 150.0–400.0)
RBC: 4.92 Mil/uL (ref 3.87–5.11)
RDW: 16 % — ABNORMAL HIGH (ref 11.5–15.5)
WBC: 8.5 10*3/uL (ref 4.0–10.5)

## 2016-06-08 LAB — TSH: TSH: 2.88 u[IU]/mL (ref 0.35–4.50)

## 2016-06-08 LAB — VITAMIN D 25 HYDROXY (VIT D DEFICIENCY, FRACTURES): VITD: 9.01 ng/mL — AB (ref 30.00–100.00)

## 2016-06-08 LAB — HEMOGLOBIN A1C: HEMOGLOBIN A1C: 5.6 % (ref 4.6–6.5)

## 2016-06-08 NOTE — Progress Notes (Signed)
Subjective:    Patient ID: Shelia Richardson, female    DOB: 01-Sep-1980, 35 y.o.   MRN: [id number]  CC: Shelia Richardson is a 35 y.o. female who presents today for physical exam.    HPI: Feeling well     Colorectal Cancer Screening: No early family history Breast Cancer Screening: No early family history Cervical Cancer Screening: Pap UTD; appt with OB GYN tomorrow Lung Cancer Screening: Doesn't have 30 year pack year history and age > 27 years.   Immunizations       Tetanus - Due        Pneumococcal - Candidate for.  Labs: Screening labs today. Exercise: Gets regular exercise.  Alcohol use: Rare Smoking/tobacco use: Current smoker.  Regular dental exams:UTD.  Wears seat belt: Yes. Skin - no concerning lesions. Doesn't go out in sun.   HISTORY:  Past Medical History:  Diagnosis Date  . Asthma   . Chicken pox   . HIV infection ([place])    Testing only.  Negative    Past Surgical History:  Procedure Laterality Date  . REPAIR PERONEAL TENDONS ANKLE  2015   Right ankle tendon  . TYMPANOSTOMY TUBE PLACEMENT     As a child   Family History  Problem Relation Age of Onset  . Arthritis Mother   . Mental illness Mother   . Mental illness Sister   . Mental illness Brother   . Cancer Maternal Grandmother     cervical , uterine cancer  . Cancer Maternal Grandfather 65    brain cancer  . Cancer Paternal Grandmother     unknown  . Diabetes Paternal Grandfather   . Diabetes Father   . Breast cancer Maternal Aunt 62      ALLERGIES: Septra [sulfamethoxazole-trimethoprim]  Current Outpatient Prescriptions on File Prior to Visit  Medication Sig Dispense Refill  . albuterol (PROVENTIL HFA;VENTOLIN HFA) 108 (90 BASE) MCG/ACT inhaler Inhale 2 puffs into the lungs every 6 (six) hours as needed for wheezing or shortness of breath. 1 Inhaler 2  . Naproxen Sodium (ALEVE PO) Take 2 tablets by mouth as needed.     No current facility-administered medications on file prior to  visit.     Social History  Substance Use Topics  . Smoking status: Current Every Day Smoker    Packs/day: 1.00    Years: 15.00    Types: Cigarettes  . Smokeless tobacco: Never Used  . Alcohol use No     Comment: 1-2 x a year    Review of Systems  Constitutional: Negative for chills, fever and unexpected weight change.  HENT: Negative for congestion.   Respiratory: Negative for cough.   Cardiovascular: Negative for chest pain, palpitations and leg swelling.  Gastrointestinal: Negative for nausea and vomiting.  Musculoskeletal: Negative for arthralgias and myalgias.  Skin: Negative for rash.  Neurological: Negative for headaches.  Hematological: Negative for adenopathy.  Psychiatric/Behavioral: Negative for confusion.      Objective:    BP 118/80   Pulse 99   Temp 97.8 F (36.6 C) (Oral)   Ht 5\' 7"  (1.702 m)   Wt 290 lb 6.4 oz (131.7 kg)   SpO2 97%   BMI 45.48 kg/m   BP Readings from Last 3 Encounters:  06/08/16 118/80  03/08/16 124/76  05/25/15 138/76   Wt Readings from Last 3 Encounters:  06/08/16 290 lb 6.4 oz (131.7 kg)  03/08/16 300 lb (136.1 kg)  05/25/15 (!) 305 lb 3.2 oz (138.4 kg)  Physical Exam  Constitutional: She appears well-developed and well-nourished.  Eyes: Conjunctivae are normal.  Neck: No thyroid mass and no thyromegaly present.  Cardiovascular: Normal rate, regular rhythm, normal heart sounds and normal pulses.   Pulmonary/Chest: Effort normal and breath sounds normal. She has no wheezes. She has no rhonchi. She has no rales. Right breast exhibits no inverted nipple, no mass, no nipple discharge, no skin change and no tenderness. Left breast exhibits no inverted nipple, no mass, no nipple discharge, no skin change and no tenderness. Breasts are symmetrical.  CBE performed.   Lymphadenopathy:       Head (right side): No submental, no submandibular, no tonsillar, no preauricular, no posterior auricular and no occipital adenopathy present.         Head (left side): No submental, no submandibular, no tonsillar, no preauricular, no posterior auricular and no occipital adenopathy present.    She has no cervical adenopathy.       Right cervical: No superficial cervical, no deep cervical and no posterior cervical adenopathy present.      Left cervical: No superficial cervical, no deep cervical and no posterior cervical adenopathy present.    She has no axillary adenopathy.  Neurological: She is alert.  Skin: Skin is warm and dry.  Psychiatric: She has a normal mood and affect. Her speech is normal and behavior is normal. Thought content normal.  Vitals reviewed.      Assessment & Plan:   Problem List Items Addressed This Visit      Other   Routine general medical examination at a health care facility - Primary    No early family history of breast or colon cancer, deferred screening. Pap UTD, will follow with GYN for now for pap. Continues to smoke, encouraged cessation. Not interested at this time. Due forTdap. Declined pneumococcal. CBE performed. No concerning skin lesions at this time. Screening labs today. Encouraged exercise.       Relevant Orders   CBC with Differential/Platelet   Comprehensive metabolic panel   Hemoglobin A1c   Lipid panel   TSH   VITAMIN D 25 Hydroxy (Vit-D Deficiency, Fractures)       I have discontinued Shelia Richardson's valACYclovir and amoxicillin-clavulanate. I am also having her maintain her Naproxen Sodium (ALEVE PO) and albuterol.   No orders of the defined types were placed in this encounter.   Return precautions given.   Risks, benefits, and alternatives of the medications and treatment plan prescribed today were discussed, and patient expressed understanding.   Education regarding symptom management and diagnosis given to patient on AVS.   Continue to follow with Mable Paris, FNP for routine health maintenance.   Renette Butters Fickling and I agreed with plan.   Mable Paris,  FNP

## 2016-06-08 NOTE — Assessment & Plan Note (Signed)
No early family history of breast or colon cancer, deferred screening. Pap UTD, will follow with GYN for now for pap. Continues to smoke, encouraged cessation. Not interested at this time. Due forTdap. Declined pneumococcal. CBE performed. No concerning skin lesions at this time. Screening labs today. Encouraged exercise.

## 2016-06-08 NOTE — Patient Instructions (Addendum)
Due for tetanus, tdap, may have done at local pharmacy.   Labs today.  Happy holidays!  Health Maintenance, Female Introduction Adopting a healthy lifestyle and getting preventive care can go a long way to promote health and wellness. Talk with your health care provider about what schedule of regular examinations is right for you. This is a good chance for you to check in with your provider about disease prevention and staying healthy. In between checkups, there are plenty of things you can do on your own. Experts have done a lot of research about which lifestyle changes and preventive measures are most likely to keep you healthy. Ask your health care provider for more information. Weight and diet Eat a healthy diet  Be sure to include plenty of vegetables, fruits, low-fat dairy products, and lean protein.  Do not eat a lot of foods high in solid fats, added sugars, or salt.  Get regular exercise. This is one of the most important things you can do for your health.  Most adults should exercise for at least 150 minutes each week. The exercise should increase your heart rate and make you sweat (moderate-intensity exercise).  Most adults should also do strengthening exercises at least twice a week. This is in addition to the moderate-intensity exercise. Maintain a healthy weight  Body mass index (BMI) is a measurement that can be used to identify possible weight problems. It estimates body fat based on height and weight. Your health care provider can help determine your BMI and help you achieve or maintain a healthy weight.  For females 44 years of age and older:  A BMI below 18.5 is considered underweight.  A BMI of 18.5 to 24.9 is normal.  A BMI of 25 to 29.9 is considered overweight.  A BMI of 30 and above is considered obese. Watch levels of cholesterol and blood lipids  You should start having your blood tested for lipids and cholesterol at 33 years of age, then have this test  every 5 years.  You may need to have your cholesterol levels checked more often if:  Your lipid or cholesterol levels are high.  You are older than 33 years of age.  You are at high risk for heart disease. Cancer screening Lung Cancer  Lung cancer screening is recommended for adults 7-2 years old who are at high risk for lung cancer because of a history of smoking.  A yearly low-dose CT scan of the lungs is recommended for people who:  Currently smoke.  Have quit within the past 15 years.  Have at least a 30-pack-year history of smoking. A pack year is smoking an average of one pack of cigarettes a day for 1 year.  Yearly screening should continue until it has been 15 years since you quit.  Yearly screening should stop if you develop a health problem that would prevent you from having lung cancer treatment. Breast Cancer  Practice breast self-awareness. This means understanding how your breasts normally appear and feel.  It also means doing regular breast self-exams. Let your health care provider know about any changes, no matter how small.  If you are in your 20s or 30s, you should have a clinical breast exam (CBE) by a health care provider every 1-3 years as part of a regular health exam.  If you are 61 or older, have a CBE every year. Also consider having a breast X-ray (mammogram) every year.  If you have a family history of breast cancer, talk  to your health care provider about genetic screening.  If you are at high risk for breast cancer, talk to your health care provider about having an MRI and a mammogram every year.  Breast cancer gene (BRCA) assessment is recommended for women who have family members with BRCA-related cancers. BRCA-related cancers include:  Breast.  Ovarian.  Tubal.  Peritoneal cancers.  Results of the assessment will determine the need for genetic counseling and BRCA1 and BRCA2 testing. Cervical Cancer  Your health care provider may  recommend that you be screened regularly for cancer of the pelvic organs (ovaries, uterus, and vagina). This screening involves a pelvic examination, including checking for microscopic changes to the surface of your cervix (Pap test). You may be encouraged to have this screening done every 3 years, beginning at age 70.  For women ages 32-65, health care providers may recommend pelvic exams and Pap testing every 3 years, or they may recommend the Pap and pelvic exam, combined with testing for human papilloma virus (HPV), every 5 years. Some types of HPV increase your risk of cervical cancer. Testing for HPV may also be done on women of any age with unclear Pap test results.  Other health care providers may not recommend any screening for nonpregnant women who are considered low risk for pelvic cancer and who do not have symptoms. Ask your health care provider if a screening pelvic exam is right for you.  If you have had past treatment for cervical cancer or a condition that could lead to cancer, you need Pap tests and screening for cancer for at least 20 years after your treatment. If Pap tests have been discontinued, your risk factors (such as having a new sexual partner) need to be reassessed to determine if screening should resume. Some women have medical problems that increase the chance of getting cervical cancer. In these cases, your health care provider may recommend more frequent screening and Pap tests. Colorectal Cancer  This type of cancer can be detected and often prevented.  Routine colorectal cancer screening usually begins at 33 years of age and continues through 33 years of age.  Your health care provider may recommend screening at an earlier age if you have risk factors for colon cancer.  Your health care provider may also recommend using home test kits to check for hidden blood in the stool.  A small camera at the end of a tube can be used to examine your colon directly  (sigmoidoscopy or colonoscopy). This is done to check for the earliest forms of colorectal cancer.  Routine screening usually begins at age 42.  Direct examination of the colon should be repeated every 5-10 years through 33 years of age. However, you may need to be screened more often if early forms of precancerous polyps or small growths are found. Skin Cancer  Check your skin from head to toe regularly.  Tell your health care provider about any new moles or changes in moles, especially if there is a change in a mole's shape or color.  Also tell your health care provider if you have a mole that is larger than the size of a pencil eraser.  Always use sunscreen. Apply sunscreen liberally and repeatedly throughout the day.  Protect yourself by wearing long sleeves, pants, a wide-brimmed hat, and sunglasses whenever you are outside. Heart disease, diabetes, and high blood pressure  High blood pressure causes heart disease and increases the risk of stroke. High blood pressure is more likely to develop  in:  People who have blood pressure in the high end of the normal range (130-139/85-89 mm Hg).  People who are overweight or obese.  People who are African American.  If you are 8-61 years of age, have your blood pressure checked every 3-5 years. If you are 24 years of age or older, have your blood pressure checked every year. You should have your blood pressure measured twice-once when you are at a hospital or clinic, and once when you are not at a hospital or clinic. Record the average of the two measurements. To check your blood pressure when you are not at a hospital or clinic, you can use:  An automated blood pressure machine at a pharmacy.  A home blood pressure monitor.  If you are between 13 years and 17 years old, ask your health care provider if you should take aspirin to prevent strokes.  Have regular diabetes screenings. This involves taking a blood sample to check your  fasting blood sugar level.  If you are at a normal weight and have a low risk for diabetes, have this test once every three years after 33 years of age.  If you are overweight and have a high risk for diabetes, consider being tested at a younger age or more often. Preventing infection Hepatitis B  If you have a higher risk for hepatitis B, you should be screened for this virus. You are considered at high risk for hepatitis B if:  You were born in a country where hepatitis B is common. Ask your health care provider which countries are considered high risk.  Your parents were born in a high-risk country, and you have not been immunized against hepatitis B (hepatitis B vaccine).  You have HIV or AIDS.  You use needles to inject street drugs.  You live with someone who has hepatitis B.  You have had sex with someone who has hepatitis B.  You get hemodialysis treatment.  You take certain medicines for conditions, including cancer, organ transplantation, and autoimmune conditions. Hepatitis C  Blood testing is recommended for:  Everyone born from [birth year] through 1963.  Anyone with known risk factors for hepatitis C. Sexually transmitted infections (STIs)  You should be screened for sexually transmitted infections (STIs) including gonorrhea and chlamydia if:  You are sexually active and are younger than 33 years of age.  You are older than 33 years of age and your health care provider tells you that you are at risk for this type of infection.  Your sexual activity has changed since you were last screened and you are at an increased risk for chlamydia or gonorrhea. Ask your health care provider if you are at risk.  If you do not have HIV, but are at risk, it may be recommended that you take a prescription medicine daily to prevent HIV infection. This is called pre-exposure prophylaxis (PrEP). You are considered at risk if:  You are sexually active and do not regularly use condoms or  know the HIV status of your partner(s).  You take drugs by injection.  You are sexually active with a partner who has HIV. Talk with your health care provider about whether you are at high risk of being infected with HIV. If you choose to begin PrEP, you should first be tested for HIV. You should then be tested every 3 months for as long as you are taking PrEP. Pregnancy  If you are premenopausal and you may become pregnant, ask your health care  provider about preconception counseling.  If you may become pregnant, take 400 to 800 micrograms (mcg) of folic acid every day.  If you want to prevent pregnancy, talk to your health care provider about birth control (contraception). Osteoporosis and menopause  Osteoporosis is a disease in which the bones lose minerals and strength with aging. This can result in serious bone fractures. Your risk for osteoporosis can be identified using a bone density scan.  If you are 44 years of age or older, or if you are at risk for osteoporosis and fractures, ask your health care provider if you should be screened.  Ask your health care provider whether you should take a calcium or vitamin D supplement to lower your risk for osteoporosis.  Menopause may have certain physical symptoms and risks.  Hormone replacement therapy may reduce some of these symptoms and risks. Talk to your health care provider about whether hormone replacement therapy is right for you. Follow these instructions at home:  Schedule regular health, dental, and eye exams.  Stay current with your immunizations.  Do not use any tobacco products including cigarettes, chewing tobacco, or electronic cigarettes.  If you are pregnant, do not drink alcohol.  If you are breastfeeding, limit how much and how often you drink alcohol.  Limit alcohol intake to no more than 1 drink per day for nonpregnant women. One drink equals 12 ounces of beer, 5 ounces of wine, or 1 ounces of hard  liquor.  Do not use street drugs.  Do not share needles.  Ask your health care provider for help if you need support or information about quitting drugs.  Tell your health care provider if you often feel depressed.  Tell your health care provider if you have ever been abused or do not feel safe at home. This information is not intended to replace advice given to you by your health care provider. Make sure you discuss any questions you have with your health care provider. Document Released: 12/27/2010 Document Revised: 11/19/2015 Document Reviewed: 03/17/2015  2017 [employer]

## 2016-06-08 NOTE — Progress Notes (Signed)
Pre visit review using our clinic review tool, if applicable. No additional management support is needed unless otherwise documented below in the visit note. 

## 2016-10-21 ENCOUNTER — Other Ambulatory Visit: Payer: Self-pay | Admitting: Nurse Practitioner

## 2016-10-21 DIAGNOSIS — B001 Herpesviral vesicular dermatitis: Secondary | ICD-10-CM

## 2016-10-24 NOTE — Telephone Encounter (Signed)
Per last office visit note, this was discontinued please advise, thanks

## 2017-03-30 ENCOUNTER — Other Ambulatory Visit: Payer: Self-pay | Admitting: Nurse Practitioner

## 2017-12-04 IMAGING — US US ABDOMEN COMPLETE
1 series · 14 of 25 positions shown · non-contrast
Comparison: None

CLINICAL DATA: Suprapubic pain.

EXAM:
ABDOMEN ULTRASOUND COMPLETE

[Series 1: us abdomen complete · 0.33mm/px · 14 of 85 slices shown]
[im 1/85]
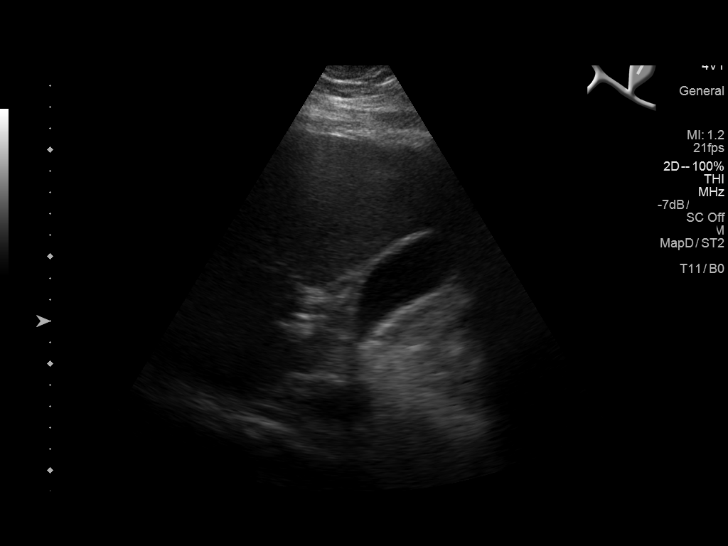
[im 8/85]
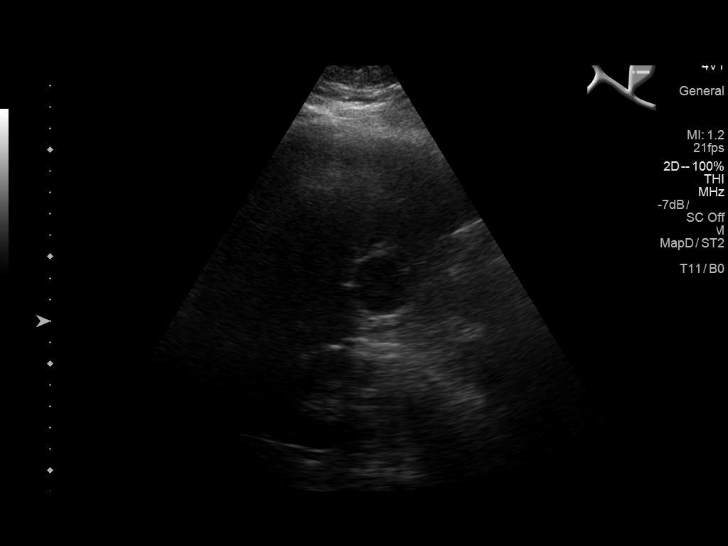
[im 15/85]
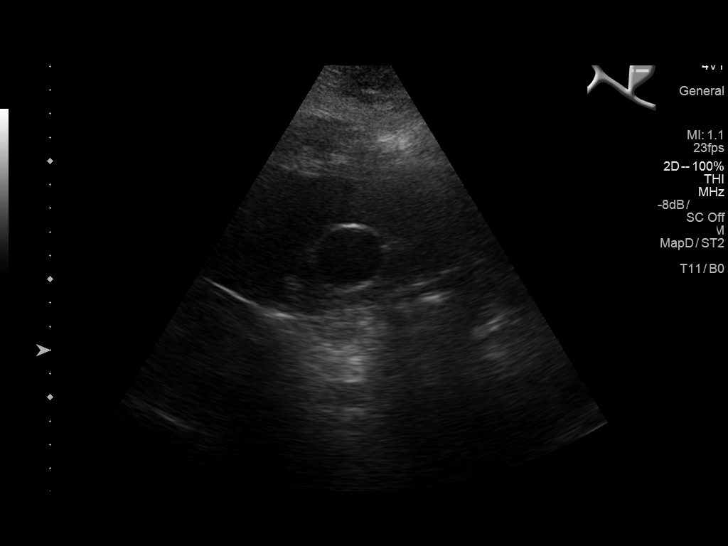
[im 22/85]
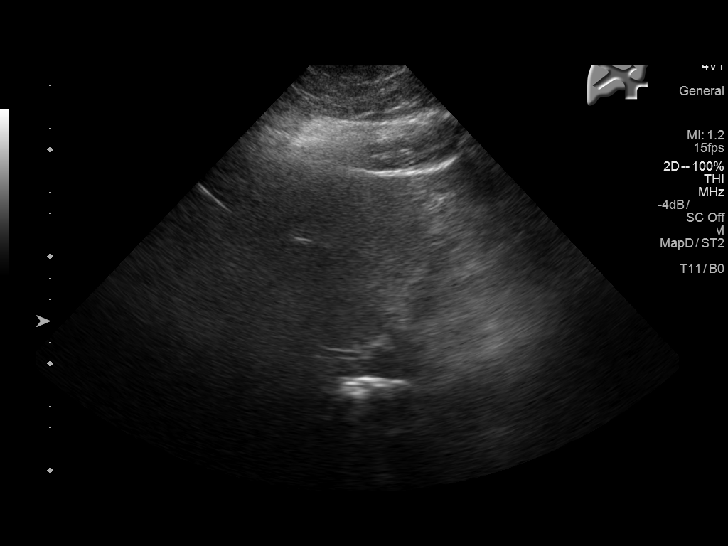
[im 29/85]
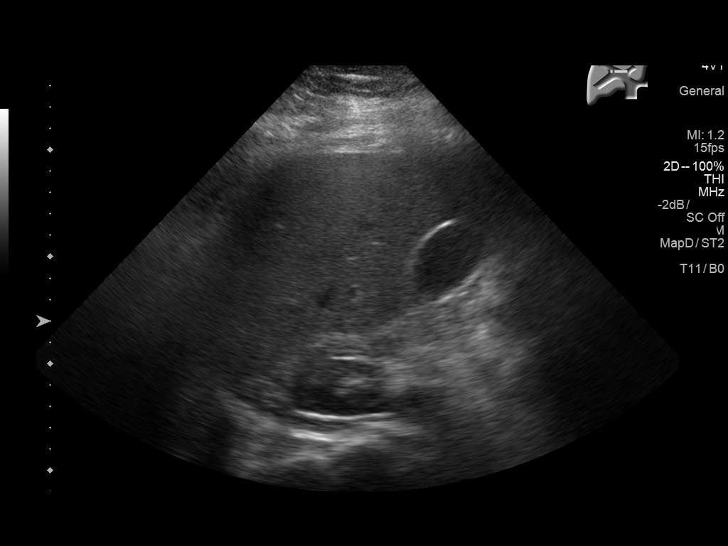
[im 32/85]
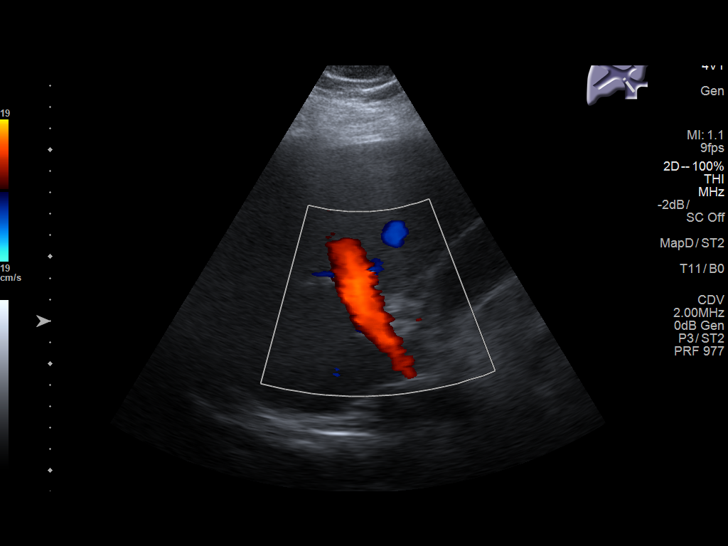
[im 39/85]
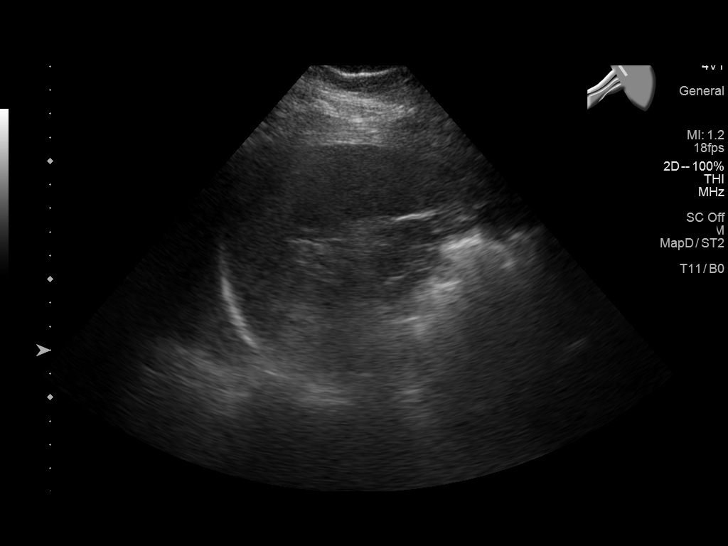
[im 46/85]
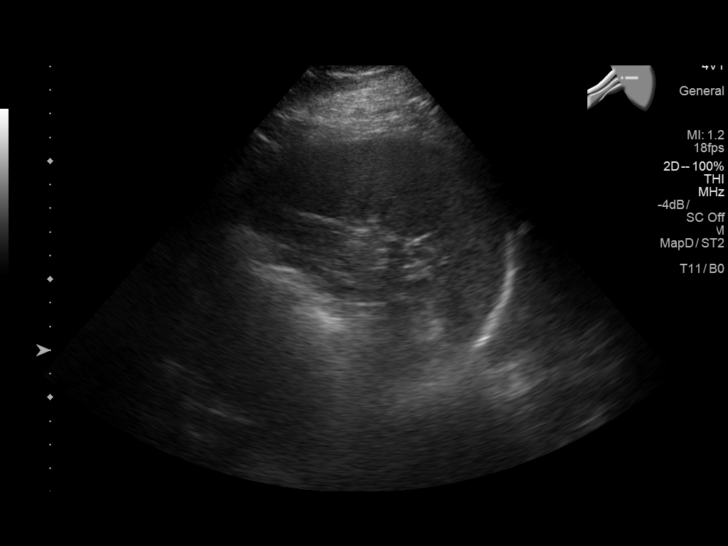
[im 53/85]
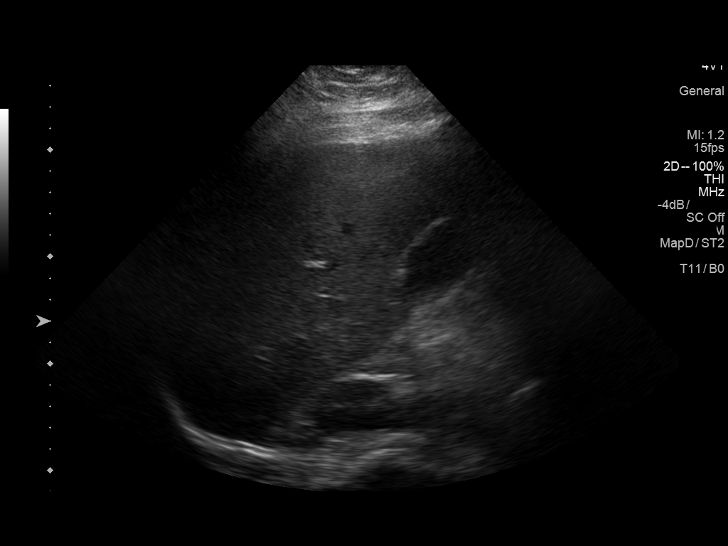
[im 57/85]
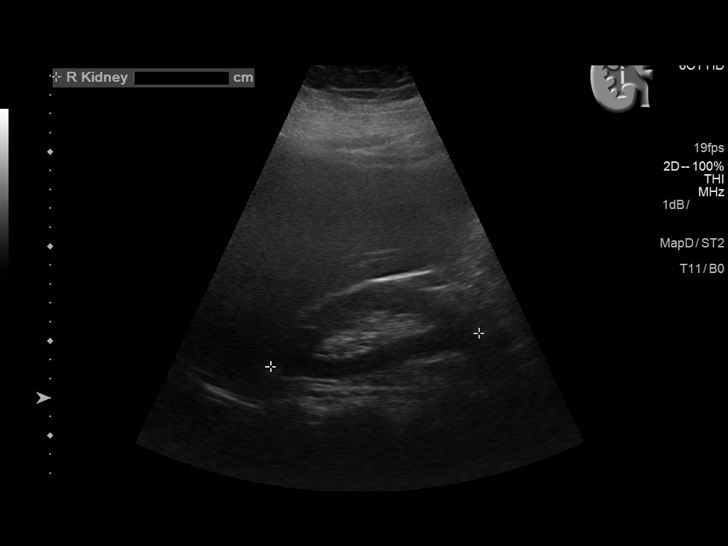
[im 64/85]
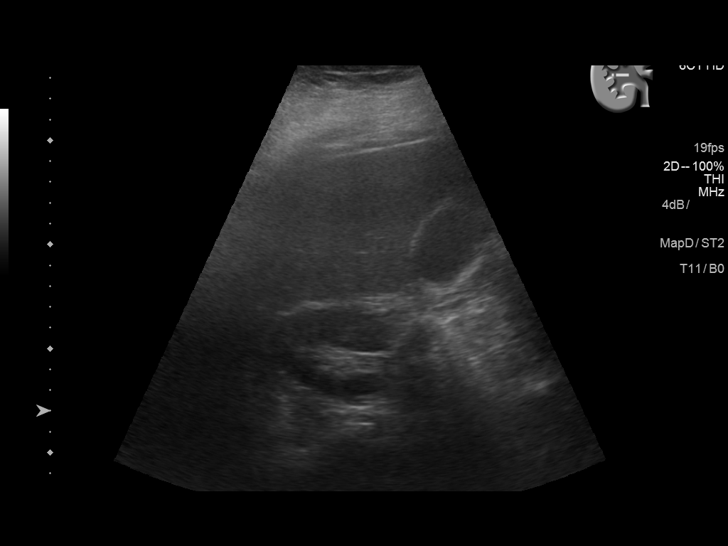
[im 71/85]
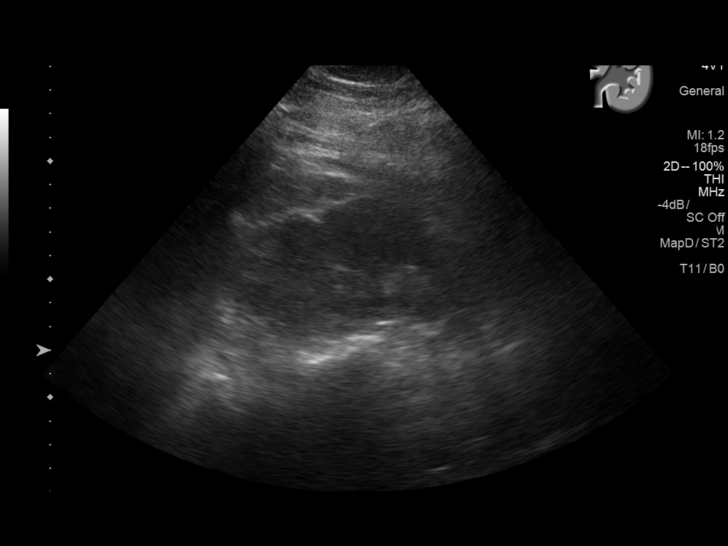
[im 78/85]
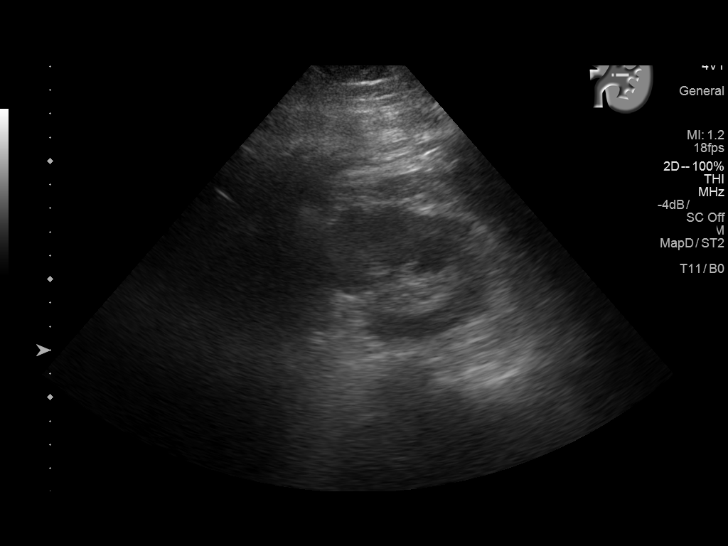
[im 85/85]
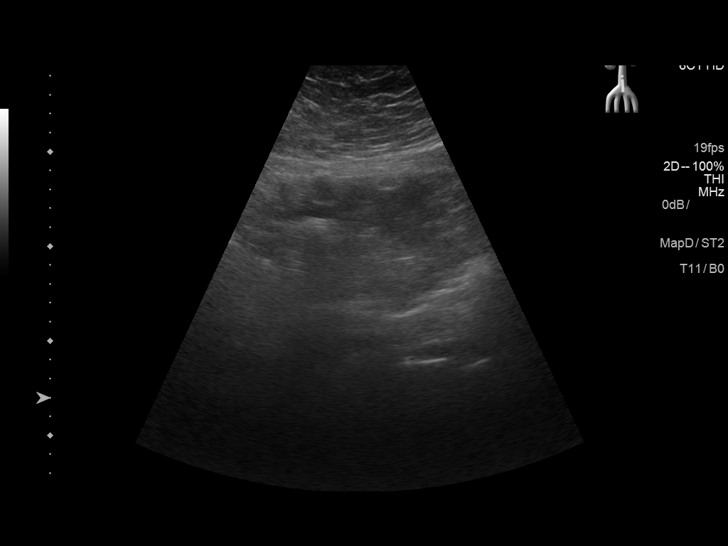

[14 of 25 positions shown; findings below may reference images not displayed]

FINDINGS: Gallbladder: No gallstones or wall thickening visualized. No
sonographic Murphy sign noted by sonographer.

Common bile duct: Diameter: Normal 3 mm

Liver: No focal lesion identified. Within normal limits in
parenchymal echogenicity.

IVC: No abnormality visualized.

Pancreas: Visualized portion unremarkable.

Spleen: Normal

Right Kidney: Length: 11.2 cm. Echogenicity within normal limits. No
mass or hydronephrosis visualized.

Left Kidney: Length: 12.6 cm. Echogenicity within normal limits. No
mass or hydronephrosis visualized.

Abdominal aorta: No aneurysm visualized.

Other findings: None.
IMPRESSION: Normal abdominal ultrasound.

## 2017-12-04 IMAGING — US US PELVIS COMPLETE
1 series · 13 of 25 positions shown · non-contrast
Comparison: Pelvic ultrasound 10/06/2012

CLINICAL DATA: Suprapubic pain



[Series 1: us pelvis complete · 0.25mm/px · 13 of 114 slices shown]
[im 1/114]
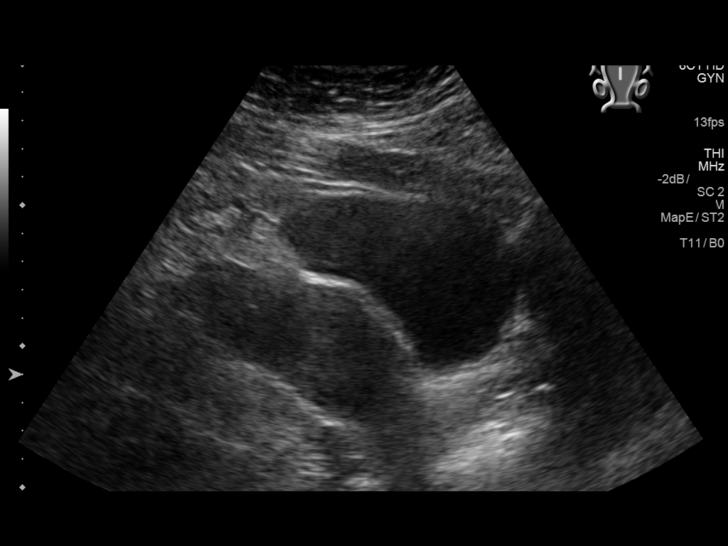
[im 10/114]
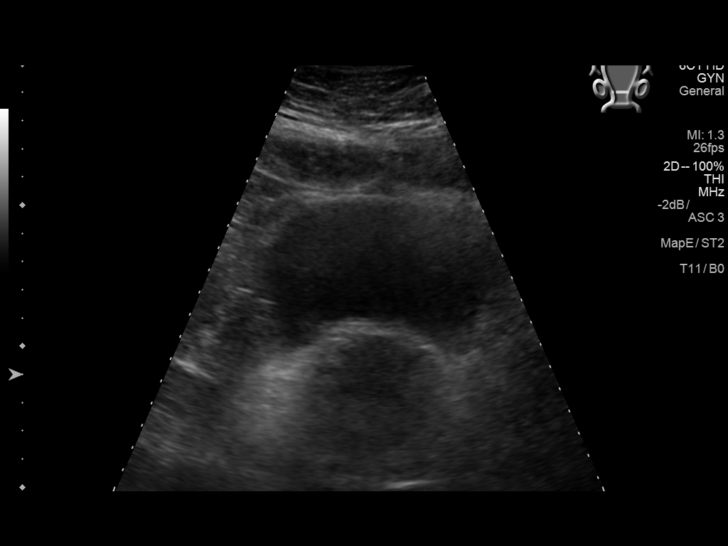
[im 19/114]
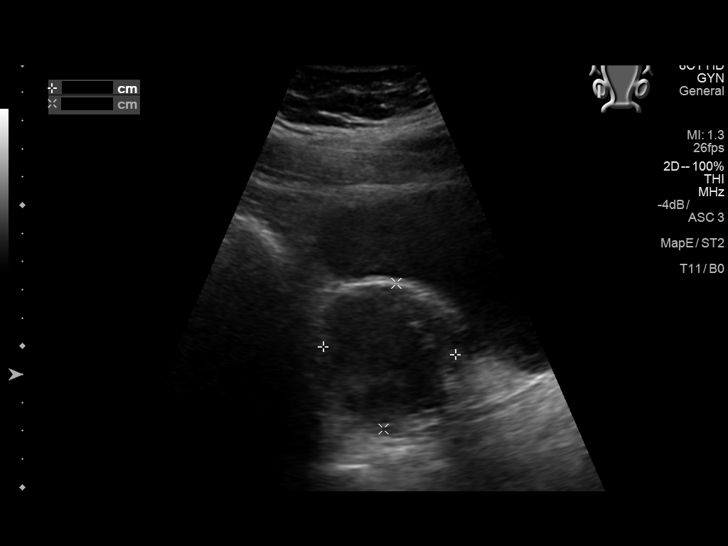
[im 29/114]
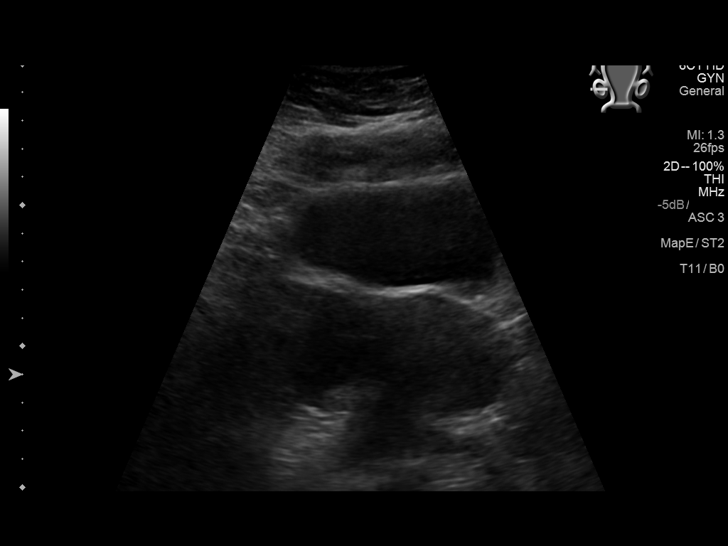
[im 38/114]
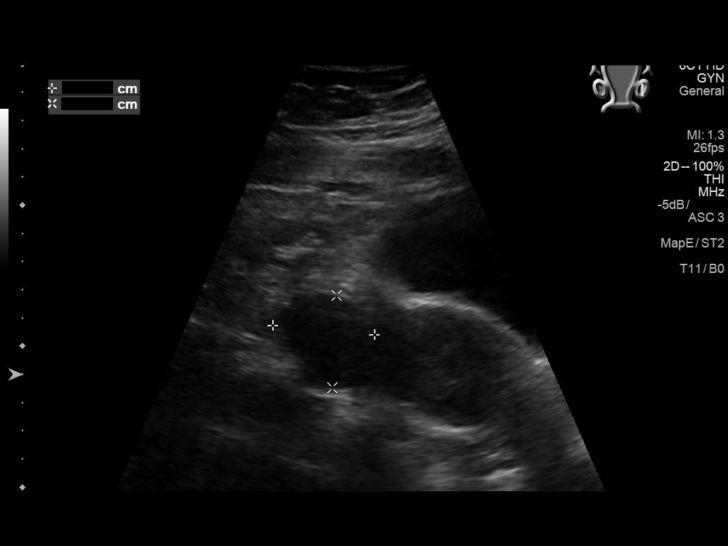
[im 48/114]
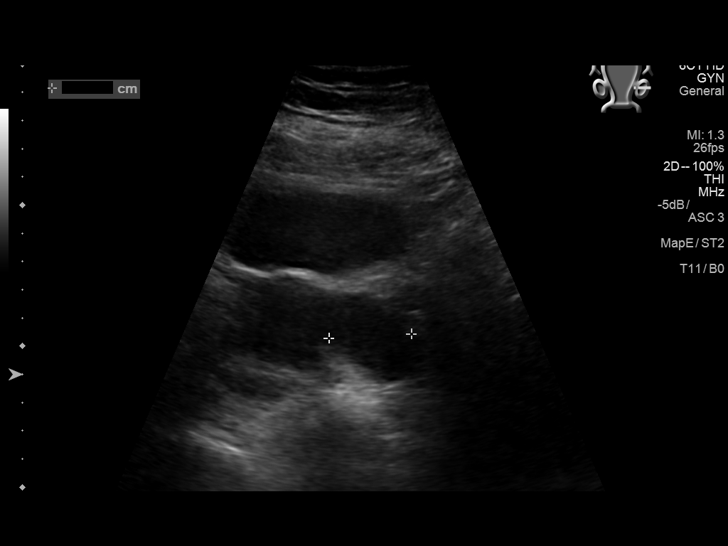
[im 57/114]
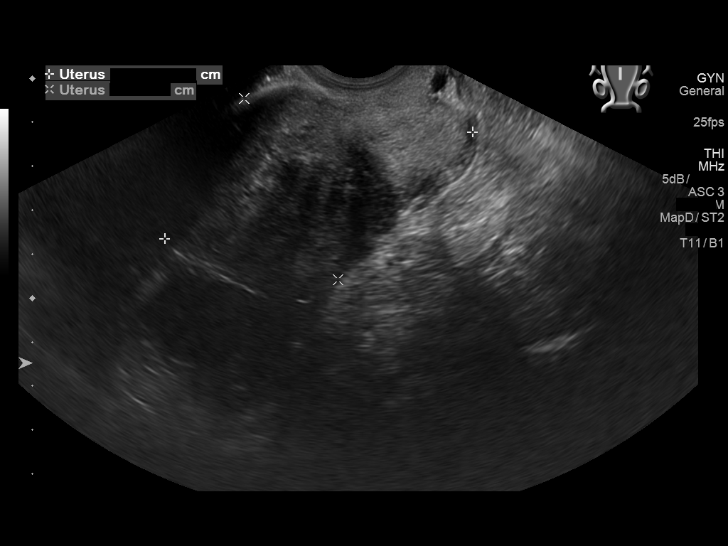
[im 66/114]
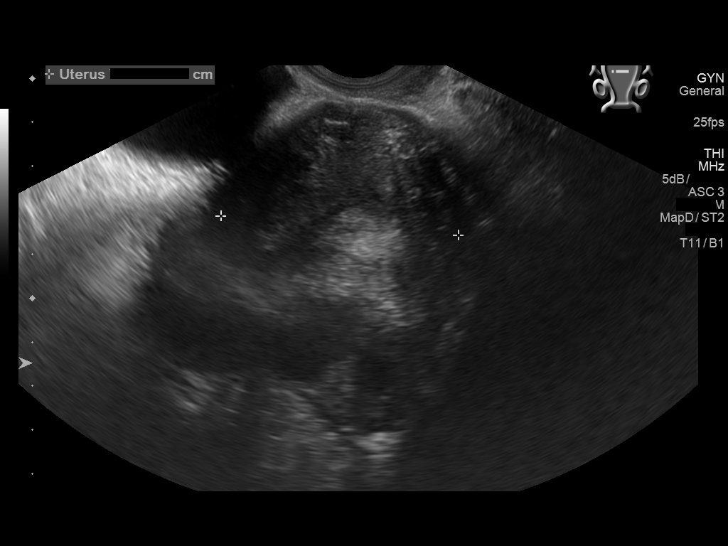
[im 76/114]
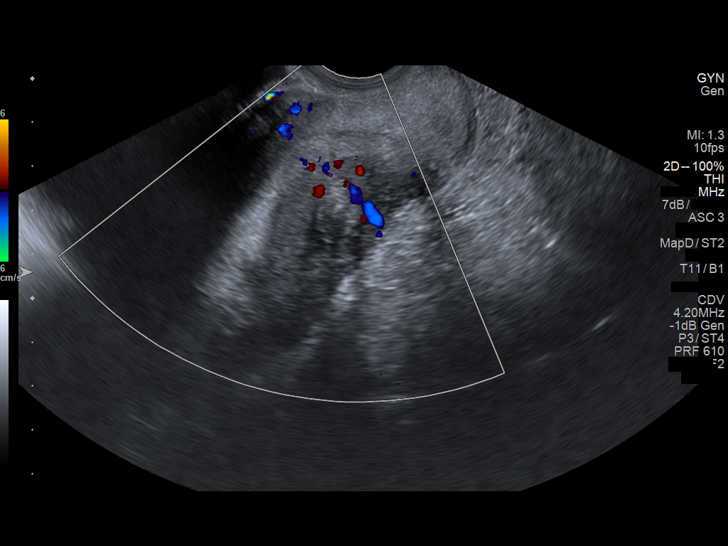
[im 85/114]
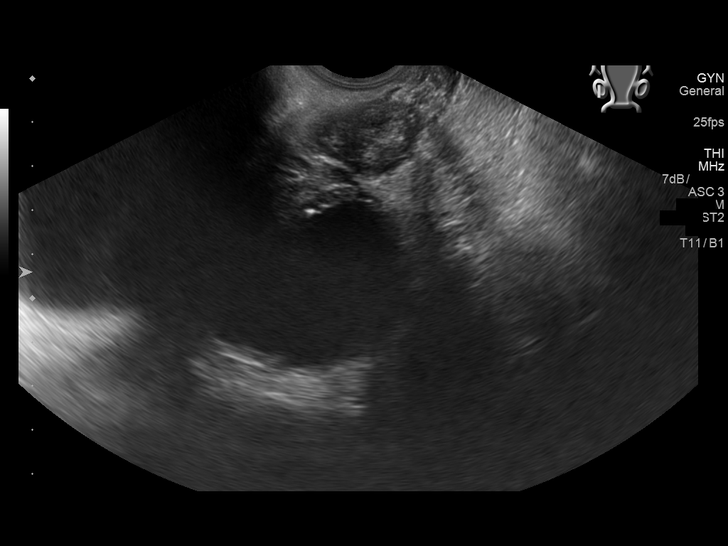
[im 95/114]
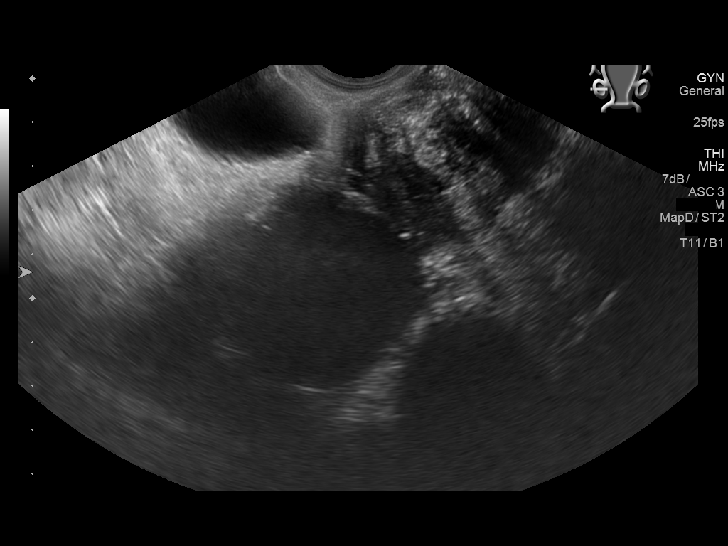
[im 104/114]
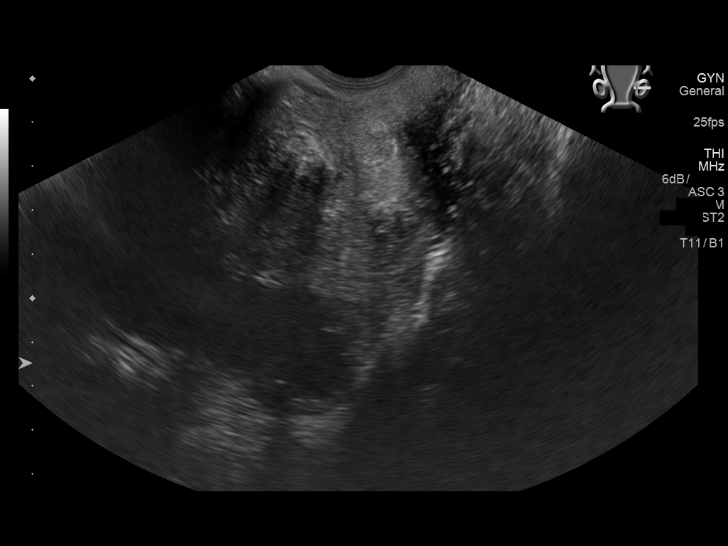
[im 114/114]
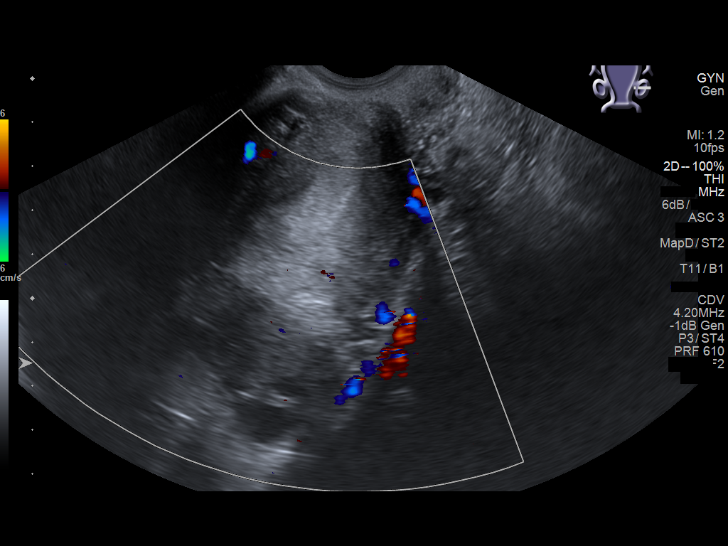

[13 of 25 positions shown; findings below may reference images not displayed]

FINDINGS: Uterus

Measurements: Normal size and echotexture measuring 8.0 x 3.9 x
cm.. Rounded mixed echogenicity lesion in the uterine body measuring
3.5 cm is most consistent with a intramural leiomyoma.

Endometrium

Thickness: Normal 7 mm for premenopausal female..

Right ovary

Measurements: Normal in size at 4.9 x 5.0 x 4.6 cm. There is a
homogeneous hypoechoic rounded lesion in the RIGHT ovary measuring
3.6 x 3.7 x 4.6 cm. No internal blood flow.

Left ovary

Measurements: Normal in size at 3.8 x 2.8 x 3.9 cm.. No mass

Other findings

No abnormal free fluid.
IMPRESSION: 1. Rounded benign-appearing lesion with homogeneous hypoechogenicity
represents either a mildly complex benign cyst or endometrioma.
Recommend follow-up ultrasounds 6 to 12 weeks. This recommendation
follows the consensus statement: Management of Asymptomatic Ovarian
and Other Adnexal Cysts Imaged at US: Society of Radiologists in
Ultrasound Consensus Conference Statement. Radiology 2747;
2. Small intramural leiomyoma.
